# Patient Record
Sex: Female | Born: 1970 | Race: White | Hispanic: No | Marital: Married | Smoking: Never smoker
Health system: Southern US, Community
[De-identification: ages and names within clinical notes are randomized; demographics above are authoritative.]

## PROBLEM LIST (undated history)

## (undated) DIAGNOSIS — F411 Generalized anxiety disorder: Secondary | ICD-10-CM

## (undated) DIAGNOSIS — R7303 Prediabetes: Secondary | ICD-10-CM

## (undated) DIAGNOSIS — T7840XA Allergy, unspecified, initial encounter: Secondary | ICD-10-CM

## (undated) DIAGNOSIS — J4520 Mild intermittent asthma, uncomplicated: Secondary | ICD-10-CM

## (undated) DIAGNOSIS — F32A Depression, unspecified: Secondary | ICD-10-CM

## (undated) DIAGNOSIS — K760 Fatty (change of) liver, not elsewhere classified: Secondary | ICD-10-CM

## (undated) DIAGNOSIS — G43909 Migraine, unspecified, not intractable, without status migrainosus: Secondary | ICD-10-CM

## (undated) DIAGNOSIS — F419 Anxiety disorder, unspecified: Secondary | ICD-10-CM

## (undated) DIAGNOSIS — I10 Essential (primary) hypertension: Secondary | ICD-10-CM

## (undated) DIAGNOSIS — K219 Gastro-esophageal reflux disease without esophagitis: Secondary | ICD-10-CM

## (undated) DIAGNOSIS — R519 Headache, unspecified: Secondary | ICD-10-CM

## (undated) HISTORY — DX: Anxiety disorder, unspecified: F41.9

## (undated) HISTORY — PX: COLONOSCOPY: SHX174

## (undated) HISTORY — DX: Allergy, unspecified, initial encounter: T78.40XA

## (undated) HISTORY — DX: Gastro-esophageal reflux disease without esophagitis: K21.9

---

## 1998-04-04 ENCOUNTER — Other Ambulatory Visit: Admission: RE | Admit: 1998-04-04 | Discharge: 1998-04-04 | Payer: Self-pay | Admitting: Obstetrics and Gynecology

## 1999-04-05 ENCOUNTER — Other Ambulatory Visit: Admission: RE | Admit: 1999-04-05 | Discharge: 1999-04-05 | Payer: Self-pay | Admitting: Obstetrics and Gynecology

## 2000-05-25 ENCOUNTER — Other Ambulatory Visit: Admission: RE | Admit: 2000-05-25 | Discharge: 2000-05-25 | Payer: Self-pay | Admitting: Obstetrics and Gynecology

## 2000-07-14 HISTORY — PX: TONSILLECTOMY: SUR1361

## 2001-06-02 ENCOUNTER — Other Ambulatory Visit: Admission: RE | Admit: 2001-06-02 | Discharge: 2001-06-02 | Payer: Self-pay | Admitting: Obstetrics and Gynecology

## 2002-06-15 ENCOUNTER — Encounter: Payer: Self-pay | Admitting: Internal Medicine

## 2002-07-26 ENCOUNTER — Other Ambulatory Visit: Admission: RE | Admit: 2002-07-26 | Discharge: 2002-07-26 | Payer: Self-pay | Admitting: Obstetrics and Gynecology

## 2003-10-26 ENCOUNTER — Other Ambulatory Visit: Admission: RE | Admit: 2003-10-26 | Discharge: 2003-10-26 | Payer: Self-pay | Admitting: Obstetrics and Gynecology

## 2004-06-13 ENCOUNTER — Ambulatory Visit: Payer: Self-pay | Admitting: Family Medicine

## 2004-10-02 ENCOUNTER — Other Ambulatory Visit: Admission: RE | Admit: 2004-10-02 | Discharge: 2004-10-02 | Payer: Self-pay | Admitting: Obstetrics and Gynecology

## 2006-11-18 ENCOUNTER — Ambulatory Visit: Payer: Self-pay | Admitting: Internal Medicine

## 2006-11-18 DIAGNOSIS — R109 Unspecified abdominal pain: Secondary | ICD-10-CM | POA: Insufficient documentation

## 2006-11-20 LAB — CONVERTED CEMR LAB
ALT: 23 units/L (ref 0–40)
AST: 22 units/L (ref 0–37)
Basophils Absolute: 0 10*3/uL (ref 0.0–0.1)
Basophils Relative: 0.4 % (ref 0.0–1.0)
Bilirubin, Direct: 0.1 mg/dL (ref 0.0–0.3)
CO2: 30 meq/L (ref 19–32)
Creatinine, Ser: 0.6 mg/dL (ref 0.4–1.2)
GFR calc non Af Amer: 120 mL/min
HCT: 36.9 % (ref 36.0–46.0)
MCHC: 34.7 g/dL (ref 30.0–36.0)
Neutrophils Relative %: 57.6 % (ref 43.0–77.0)
Phosphorus: 4.1 mg/dL (ref 2.3–4.6)
RBC: 4.23 M/uL (ref 3.87–5.11)
RDW: 11.9 % (ref 11.5–14.6)
Sodium: 142 meq/L (ref 135–145)
Total Bilirubin: 0.4 mg/dL (ref 0.3–1.2)
WBC: 10.3 10*3/uL (ref 4.5–10.5)

## 2007-05-13 ENCOUNTER — Telehealth (INDEPENDENT_AMBULATORY_CARE_PROVIDER_SITE_OTHER): Payer: Self-pay | Admitting: *Deleted

## 2007-06-16 ENCOUNTER — Ambulatory Visit: Payer: Self-pay | Admitting: Internal Medicine

## 2007-06-30 ENCOUNTER — Encounter: Payer: Self-pay | Admitting: Internal Medicine

## 2007-06-30 ENCOUNTER — Ambulatory Visit: Payer: Self-pay | Admitting: Internal Medicine

## 2007-08-05 DIAGNOSIS — Z8719 Personal history of other diseases of the digestive system: Secondary | ICD-10-CM | POA: Insufficient documentation

## 2007-08-05 DIAGNOSIS — K644 Residual hemorrhoidal skin tags: Secondary | ICD-10-CM | POA: Insufficient documentation

## 2007-08-05 DIAGNOSIS — F411 Generalized anxiety disorder: Secondary | ICD-10-CM | POA: Insufficient documentation

## 2007-08-05 DIAGNOSIS — J3089 Other allergic rhinitis: Secondary | ICD-10-CM | POA: Insufficient documentation

## 2008-07-12 ENCOUNTER — Ambulatory Visit: Payer: Self-pay | Admitting: Family Medicine

## 2010-11-26 NOTE — Assessment & Plan Note (Signed)
[facility]                         GASTROENTEROLOGY OFFICE NOTE   RHEGAN, TRUNNELL                         MRN:          [id number]  DATE:06/16/2007                            DOB:          Oct 04, 1970    CHIEF COMPLAINT:  Burping, gas, bloating, question celiac disease.   HISTORY:  This is a 40 year old white woman whom I have followed for a  family history of colon cancer and polyp.  I sent her a note to come for  a colonoscopy because of the belching and burping problems and bloating  and gaseousness.  She has come for a visit, wondering if she could have  celiac disease.   Review of the notes in the EMR shows that she has been tried being off  of dairy, which she tells me she has tried Lactaid pills which gave her  cramps.  She does not use mints or anything like that.  She does not  really use caffeine.  If she takes Prilosec, she does not have heartburn  or pyrosis or episodes of feeling warm all over at night; however, she  does have this belching quite frequently.  She does not really mind it,  but she wonders about it.  The bloating and abdominal distention bother  her more.  She had some of that when I saw her in 2003, and I did do  duodenal biopsies, when I performed an upper endoscopy.  These were  negative for celiac disease.  She had a mild chronic gastritis.  Her  colonoscopy was unrevealing at that time, i.e. no polyps.   LABORATORY DATA:  In May were normal with a CBC and CMET as well.  TSH  was normal also.   REVIEW OF SYSTEMS:  She has other problems with bleeding gums.  Her  husband complains that she has bad breath.  She has problems with sinus  drainage quite frequently.  The gums bleed.  She is on some type of oral  prescription mouth wash that may help.  They really bleed when she has  brushed them.  Her GI review of systems is otherwise negative, other  than she perhaps had some dark stools.  She complains of some  epigastric  pain moving over towards the right, but it is very vague in character  and not sharp.  She does still have her gallbladder.  See the medical  history format as above.  Otherwise negative.   CURRENT MEDICATIONS:  1. Effexor XR 150 mg daily.  2. TriNessa 28, one tab daily.  3. Prilosec OTC taken daily.  4. Over-the-counter fiber supplements are used sometimes.   ALLERGIES:  No known drug allergies.   PAST MEDICAL/SURGICAL HISTORY:  1. EGD results as above.  2. Gastroesophageal reflux disease.  3. Allergies.  4. Chronic headaches.  5. Sinus drainage.  6. Prior tonsillectomy in 2001.  7. Gingivitis problems.   FAMILY HISTORY:  Heart disease in her mother.  Prostate cancer in her  father.  Father had colon polyps in his 81's.  She had a paternal aunt  with colon cancer in  his 35's.  She has a brother who had colon polyps  in his 47's and he is under surveillance.   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  That is going well.  Some college education.  One son at home.  No  tobacco, alcohol or drugs.   PHYSICAL EXAMINATION:  GENERAL:  A well-developed and well-nourished  female.  VITAL SIGNS:  Height 5 feet 3 inches, weight 128 pounds, blood pressure  96/62, pulse 88.  HEENT:  Eyes anicteric.  ENT:  Mouth, posterior pharynx and gums look  normal to me today.  NECK:  Supple, no thyromegaly.  CHEST:  Clear.  HEART:  S1 and S2.  No murmurs or gallops.  ABDOMEN:  Soft, nontender.  No organomegaly or mass.  EXTREMITIES:  Free of edema.  SKIN:  Warm and dry.  I see no rash on the extensor surfaces or  elsewhere on the upper trunk.  LYMPH:  No neck or supraclavicular nodes.  NEUROLOGIC:  Intact.  She is alert and oriented x3.   ASSESSMENT:  1. Bloating and dyspepsia, etiology not clear:  Celiac disease is      possible, though less likely with the negative biopsies.  2. Halitosis probably from sinus drainage or perhaps the gum problem:       This is almost never from gastrointestinal problem.  3. Belching:  It may be that she has an incompetent lower esophageal      sphincter, which leads to reflux and she cannot control air at this      time.  I think this is possible.   PLAN:  1. Tissue transglutaminase antibody and IgA level to double check for      celiac disease.  2. Schedule a screening colonoscopy, because it has been five years.      This is for a family history of colon cancer and polyps.  3. A trial of Align for bloating and problems that are probably      irritable bowel, but could be occult celiac disease.  4. Continue Prilosec daily.  5. Further plans pending the clinical course and these results.     Iva Boop, MD,FACG  Electronically Signed   CEG/MedQ  DD: 06/16/2007  DT: 06/16/2007  Job #: [id number]   cc:   Karie Schwalbe, MD

## 2012-05-21 ENCOUNTER — Encounter: Payer: Self-pay | Admitting: Internal Medicine

## 2013-03-09 ENCOUNTER — Encounter: Payer: Self-pay | Admitting: Internal Medicine

## 2014-06-28 ENCOUNTER — Other Ambulatory Visit: Payer: Self-pay | Admitting: Obstetrics and Gynecology

## 2014-06-29 LAB — CYTOLOGY - PAP

## 2016-05-31 DIAGNOSIS — Z23 Encounter for immunization: Secondary | ICD-10-CM | POA: Diagnosis not present

## 2016-06-19 ENCOUNTER — Ambulatory Visit (INDEPENDENT_AMBULATORY_CARE_PROVIDER_SITE_OTHER): Payer: BLUE CROSS/BLUE SHIELD | Admitting: Family Medicine

## 2016-06-19 ENCOUNTER — Encounter: Payer: Self-pay | Admitting: Family Medicine

## 2016-06-19 VITALS — BP 118/66 | HR 106 | Temp 98.0°F | Resp 18 | Ht 63.0 in | Wt 148.0 lb

## 2016-06-19 DIAGNOSIS — N3090 Cystitis, unspecified without hematuria: Secondary | ICD-10-CM | POA: Diagnosis not present

## 2016-06-19 DIAGNOSIS — R000 Tachycardia, unspecified: Secondary | ICD-10-CM | POA: Diagnosis not present

## 2016-06-19 DIAGNOSIS — Z114 Encounter for screening for human immunodeficiency virus [HIV]: Secondary | ICD-10-CM

## 2016-06-19 DIAGNOSIS — R142 Eructation: Secondary | ICD-10-CM

## 2016-06-19 DIAGNOSIS — G4709 Other insomnia: Secondary | ICD-10-CM | POA: Diagnosis not present

## 2016-06-19 DIAGNOSIS — E781 Pure hyperglyceridemia: Secondary | ICD-10-CM | POA: Diagnosis not present

## 2016-06-19 DIAGNOSIS — R5383 Other fatigue: Secondary | ICD-10-CM | POA: Diagnosis not present

## 2016-06-19 DIAGNOSIS — K219 Gastro-esophageal reflux disease without esophagitis: Secondary | ICD-10-CM | POA: Diagnosis not present

## 2016-06-19 DIAGNOSIS — Z23 Encounter for immunization: Secondary | ICD-10-CM | POA: Diagnosis not present

## 2016-06-19 DIAGNOSIS — Z131 Encounter for screening for diabetes mellitus: Secondary | ICD-10-CM

## 2016-06-19 DIAGNOSIS — F411 Generalized anxiety disorder: Secondary | ICD-10-CM | POA: Diagnosis not present

## 2016-06-19 MED ORDER — TRAZODONE HCL 50 MG PO TABS: 25.0000 mg | ORAL_TABLET | Freq: Every evening | ORAL | 0 refills | Status: DC | PRN

## 2016-06-19 NOTE — Progress Notes (Signed)
Name: Mikayla Fritz   MRN: [id number]    DOB: May 28, 1971   Date:06/19/2016       Progress Note  Subjective  Chief Complaint  Chief Complaint  Patient presents with  . Establish Care  . Gastroesophageal Reflux    Would like to be tested for lactose intolerance, due to having excessive belching    HPI  GAD: she has been getting Effexor, given by GYN for many years. She states she does not feel depressed, there is a lot of family history of anger and medication keeps her calm.   Insomnia: she has difficulty falling asleep, but able to stay asleep. She states that her husband snores and jerks during the night and frustrates her, she gets angry about it  GERD: doing well at this time, takes Tums prn or Ranitidine. Usually triggered by eating spicy food. Red sauces. Currently controlled with life style modification. She is still drinking tea.   Eructation: she feels like she burps all the time, no abdominal pain, no flatulence. She has been avoiding dairy and is doing better.   Hypertriglyceridemia: found by gyn, but not sure what it was . We will recheck labs   Patient Active Problem List   Diagnosis Date Noted  . GERD without esophagitis 06/19/2016  . Hypertriglyceridemia 06/19/2016  . Recurrent cystitis 06/19/2016  . External hemorrhoids 08/05/2007  . Perennial allergic rhinitis 08/05/2007  . History of gastritis 08/05/2007  . GAD (generalized anxiety disorder) 08/05/2007    Past Surgical History:  Procedure Laterality Date  . TONSILLECTOMY  2002    Family History  Problem Relation Age of Onset  . Heart disease Mother   . Cancer Mother     Kidney  . Heart attack Mother     3  . Arthritis Mother   . Thyroid disease Mother   . Hypertension Father   . Diabetes Father   . Anxiety disorder Brother   . Cancer Maternal Grandmother     Breast    Social History   Social History  . Marital status: Married    Spouse name: N/A  . Number of children: N/A  . Years of  education: N/A   Occupational History  . Not on file.   Social History Main Topics  . Smoking status: Never Smoker  . Smokeless tobacco: Never Used  . Alcohol use No  . Drug use: No  . Sexual activity: Yes    Partners: Male     Comment: Husband had vasectomy   Other Topics Concern  . Not on file   Social History Narrative   Works at [employer], married     Current Outpatient Prescriptions:  .  SULFAMETHOXAZOLE-TRIMETHOPRIM PO, Take 1 tablet by mouth 2 (two) times daily as needed., Disp: , Rfl:  .  venlafaxine XR (EFFEXOR-XR) 150 MG 24 hr capsule, Take 150 mg by mouth daily with breakfast., Disp: , Rfl:   Allergies  Allergen Reactions  . Codeine     REACTION: makes feel loopy     ROS  Constitutional: Negative for fever or weight change.  Respiratory: Negative for cough and shortness of breath.   Cardiovascular: Negative for chest pain or palpitations.  Gastrointestinal: Negative for abdominal pain, no bowel changes.  Musculoskeletal: Negative for gait problem or joint swelling.  Skin: Negative for rash.  Neurological: Negative for dizziness or headache.  No other specific complaints in a complete review of systems (except as listed in HPI above).  Objective  Vitals:   06/19/16  1020  BP: 118/66  Pulse: (!) 127  Resp: 18  Temp: 98 F (36.7 C)  TempSrc: Oral  SpO2: 98%  Weight: 148 lb (67.1 kg)  Height: 5\' 3"  (1.6 m)    Body mass index is 26.22 kg/m.  Physical Exam  Constitutional: Patient appears well-developed and well-nourished. No distress.  HEENT: head atraumatic, normocephalic, pupils equal and reactive to light, neck supple, throat within normal limits Cardiovascular: elevated heart rate, regular rhythm and normal heart sounds.  No murmur heard. No BLE edema. Pulmonary/Chest: Effort normal and breath sounds normal. No respiratory distress. Abdominal: Soft.  There is no tenderness. Psychiatric: Patient has a normal mood and affect. behavior is  normal. Judgment and thought content normal.  PHQ2/9: Depression screen PHQ 2/9 06/19/2016  Decreased Interest 0  Down, Depressed, Hopeless 0  PHQ - 2 Score 0     Fall Risk: Fall Risk  06/19/2016  Falls in the past year? No   GAD 7 : Generalized Anxiety Score 06/19/2016  Nervous, Anxious, on Edge 0  Control/stop worrying 0  Worry too much - different things 0  Trouble relaxing 3  Restless 0  Easily annoyed or irritable 3  Afraid - awful might happen 0  Total GAD 7 Score 6  Anxiety Difficulty Somewhat difficult     Assessment & Plan  1. Hypertriglyceridemia  - Lipid panel  2. GERD without esophagitis  Continue prn medication   3. Encounter for screening for HIV  - HIV antibody  4. Diabetes mellitus screening  -hgbA1C  5. Recurrent cystitis  Continue Septra after intercourse  6. GAD (generalized anxiety disorder)  Continue medication   7. Need for Tdap vaccination  -Tdap  8. Eructation  Discussed gas-X  9. Other fatigue  - COMPLETE METABOLIC PANEL WITH GFR - TSH - VITAMIN D 25 Hydroxy (Vit-D Deficiency, Fractures) - Vitamin B12 - CBC with Differential/Platelet - Hemoglobin A1c  10. Other insomnia  - traZODone (DESYREL) 50 MG tablet; Take 0.5-1 tablets (25-50 mg total) by mouth at bedtime as needed for sleep.  Dispense: 30 tablet; Refill: 0   11. Tachycardia  - EKG 12-Lead - normal

## 2016-06-20 LAB — CBC WITH DIFFERENTIAL/PLATELET
BASOS ABS: 0 {cells}/uL (ref 0–200)
Basophils Relative: 0 %
EOS PCT: 1 %
Eosinophils Absolute: 149 cells/uL (ref 15–500)
HCT: 41 % (ref 35.0–45.0)
Hemoglobin: 13.7 g/dL (ref 11.7–15.5)
Lymphocytes Relative: 28 %
Lymphs Abs: 4172 cells/uL — ABNORMAL HIGH (ref 850–3900)
MCH: 29.4 pg (ref 27.0–33.0)
MCHC: 33.4 g/dL (ref 32.0–36.0)
MCV: 88 fL (ref 80.0–100.0)
MONOS PCT: 4 %
MPV: 8.7 fL (ref 7.5–12.5)
Monocytes Absolute: 596 cells/uL (ref 200–950)
NEUTROS ABS: 9983 {cells}/uL — AB (ref 1500–7800)
NEUTROS PCT: 67 %
PLATELETS: 371 10*3/uL (ref 140–400)
RBC: 4.66 MIL/uL (ref 3.80–5.10)
RDW: 13.6 % (ref 11.0–15.0)
WBC: 14.9 10*3/uL — AB (ref 3.8–10.8)

## 2016-06-20 LAB — COMPLETE METABOLIC PANEL WITH GFR
ALBUMIN: 4.3 g/dL (ref 3.6–5.1)
ALK PHOS: 83 U/L (ref 33–115)
ALT: 20 U/L (ref 6–29)
AST: 18 U/L (ref 10–35)
BILIRUBIN TOTAL: 0.4 mg/dL (ref 0.2–1.2)
BUN: 10 mg/dL (ref 7–25)
CALCIUM: 9 mg/dL (ref 8.6–10.2)
CHLORIDE: 106 mmol/L (ref 98–110)
CO2: 24 mmol/L (ref 20–31)
CREATININE: 0.74 mg/dL (ref 0.50–1.10)
GFR, Est Non African American: >89 mL/min (ref >=60)
Glucose, Bld: 91 mg/dL (ref 65–99)
Potassium: 4.3 mmol/L (ref 3.5–5.3)
Sodium: 140 mmol/L (ref 135–146)
TOTAL PROTEIN: 6.7 g/dL (ref 6.1–8.1)

## 2016-06-20 LAB — LIPID PANEL
CHOLESTEROL: 198 mg/dL (ref <200)
HDL: 36 mg/dL — ABNORMAL LOW (ref >50)
LDL Cholesterol: 91 mg/dL (ref <100)
Total CHOL/HDL Ratio: 5.5 Ratio — ABNORMAL HIGH (ref <5.0)
Triglycerides: 354 mg/dL — ABNORMAL HIGH (ref <150)
VLDL: 71 mg/dL — AB (ref <30)

## 2016-06-20 LAB — VITAMIN B12: VITAMIN B 12: 935 pg/mL (ref 200–1100)

## 2016-06-20 LAB — TSH: TSH: 2.14 mIU/L

## 2016-06-21 LAB — HIV ANTIBODY (ROUTINE TESTING W REFLEX): HIV 1&2 Ab, 4th Generation: NONREACTIVE

## 2016-06-21 LAB — HEMOGLOBIN A1C
HEMOGLOBIN A1C: 5.1 % (ref <5.7)
Mean Plasma Glucose: 100 mg/dL

## 2016-06-21 LAB — VITAMIN D 25 HYDROXY (VIT D DEFICIENCY, FRACTURES): VIT D 25 HYDROXY: 24 ng/mL — AB (ref 30–100)

## 2016-06-22 ENCOUNTER — Other Ambulatory Visit: Payer: Self-pay | Admitting: Family Medicine

## 2016-06-22 DIAGNOSIS — D72829 Elevated white blood cell count, unspecified: Secondary | ICD-10-CM

## 2016-06-22 MED ORDER — OMEGA-3-ACID ETHYL ESTERS 1 G PO CAPS: 1.0000 g | ORAL_CAPSULE | Freq: Two times a day (BID) | ORAL | 2 refills | Status: DC

## 2016-06-23 ENCOUNTER — Telehealth: Payer: Self-pay | Admitting: Family Medicine

## 2016-06-23 NOTE — Telephone Encounter (Signed)
Patient returning your call.

## 2016-06-23 NOTE — Telephone Encounter (Signed)
Mikayla Fritz spoke with patient

## 2016-06-27 ENCOUNTER — Other Ambulatory Visit: Payer: Self-pay

## 2016-06-27 DIAGNOSIS — D72829 Elevated white blood cell count, unspecified: Secondary | ICD-10-CM

## 2016-06-27 LAB — CBC WITH DIFFERENTIAL/PLATELET
BASOS ABS: 0 {cells}/uL (ref 0–200)
Basophils Relative: 0 %
EOS PCT: 2 %
Eosinophils Absolute: 224 cells/uL (ref 15–500)
HCT: 41.2 % (ref 35.0–45.0)
HEMOGLOBIN: 13.8 g/dL (ref 11.7–15.5)
LYMPHS PCT: 41 %
Lymphs Abs: 4592 cells/uL — ABNORMAL HIGH (ref 850–3900)
MCH: 29.2 pg (ref 27.0–33.0)
MCHC: 33.5 g/dL (ref 32.0–36.0)
MCV: 87.3 fL (ref 80.0–100.0)
MONOS PCT: 5 %
MPV: 8.7 fL (ref 7.5–12.5)
Monocytes Absolute: 560 cells/uL (ref 200–950)
NEUTROS PCT: 52 %
Neutro Abs: 5824 cells/uL (ref 1500–7800)
Platelets: 374 10*3/uL (ref 140–400)
RBC: 4.72 MIL/uL (ref 3.80–5.10)
RDW: 13.4 % (ref 11.0–15.0)
WBC: 11.2 10*3/uL — ABNORMAL HIGH (ref 3.8–10.8)

## 2016-09-08 DIAGNOSIS — R319 Hematuria, unspecified: Secondary | ICD-10-CM | POA: Diagnosis not present

## 2016-09-08 DIAGNOSIS — Z01419 Encounter for gynecological examination (general) (routine) without abnormal findings: Secondary | ICD-10-CM | POA: Diagnosis not present

## 2016-09-08 DIAGNOSIS — N390 Urinary tract infection, site not specified: Secondary | ICD-10-CM | POA: Diagnosis not present

## 2016-09-08 DIAGNOSIS — Z1231 Encounter for screening mammogram for malignant neoplasm of breast: Secondary | ICD-10-CM | POA: Diagnosis not present

## 2016-09-08 DIAGNOSIS — Z6826 Body mass index (BMI) 26.0-26.9, adult: Secondary | ICD-10-CM | POA: Diagnosis not present

## 2016-09-08 LAB — HM PAP SMEAR: HM PAP: NEGATIVE

## 2016-09-09 LAB — HM MAMMOGRAPHY: HM MAMMO: NORMAL (ref 0–4)

## 2016-09-18 ENCOUNTER — Ambulatory Visit (INDEPENDENT_AMBULATORY_CARE_PROVIDER_SITE_OTHER): Payer: BLUE CROSS/BLUE SHIELD | Admitting: Family Medicine

## 2016-09-18 ENCOUNTER — Encounter: Payer: Self-pay | Admitting: Family Medicine

## 2016-09-18 VITALS — BP 126/64 | HR 120 | Temp 97.9°F | Resp 18 | Ht 63.0 in | Wt 148.2 lb

## 2016-09-18 DIAGNOSIS — E786 Lipoprotein deficiency: Secondary | ICD-10-CM | POA: Diagnosis not present

## 2016-09-18 DIAGNOSIS — D72829 Elevated white blood cell count, unspecified: Secondary | ICD-10-CM | POA: Diagnosis not present

## 2016-09-18 DIAGNOSIS — R3129 Other microscopic hematuria: Secondary | ICD-10-CM

## 2016-09-18 DIAGNOSIS — F411 Generalized anxiety disorder: Secondary | ICD-10-CM

## 2016-09-18 DIAGNOSIS — K219 Gastro-esophageal reflux disease without esophagitis: Secondary | ICD-10-CM

## 2016-09-18 DIAGNOSIS — E781 Pure hyperglyceridemia: Secondary | ICD-10-CM

## 2016-09-18 LAB — CBC WITH DIFFERENTIAL/PLATELET
BASOS PCT: 0 %
Basophils Absolute: 0 cells/uL (ref 0–200)
Eosinophils Absolute: 103 cells/uL (ref 15–500)
Eosinophils Relative: 1 %
HCT: 41.8 % (ref 35.0–45.0)
HEMOGLOBIN: 13.6 g/dL (ref 11.7–15.5)
LYMPHS ABS: 3502 {cells}/uL (ref 850–3900)
Lymphocytes Relative: 34 %
MCH: 28.6 pg (ref 27.0–33.0)
MCHC: 32.5 g/dL (ref 32.0–36.0)
MCV: 88 fL (ref 80.0–100.0)
MONO ABS: 515 {cells}/uL (ref 200–950)
MPV: 8.9 fL (ref 7.5–12.5)
Monocytes Relative: 5 %
NEUTROS PCT: 60 %
Neutro Abs: 6180 cells/uL (ref 1500–7800)
Platelets: 361 10*3/uL (ref 140–400)
RBC: 4.75 MIL/uL (ref 3.80–5.10)
RDW: 13.3 % (ref 11.0–15.0)
WBC: 10.3 10*3/uL (ref 3.8–10.8)

## 2016-09-18 NOTE — Progress Notes (Signed)
Name: Mikayla Fritz   MRN: [id number]    DOB: 06-15-1971   Date:09/18/2016       Progress Note  Subjective  Chief Complaint  Chief Complaint  Patient presents with  . Medication Refill    3 month F/U  . GAD    Well controlled with medication    HPI  GAD: she has been getting Effexor, given by GYN for many years. She states she does not feel depressed, there is a lot of family history of anger and medication keeps her calm.   Insomnia: she has difficulty falling asleep, but able to stay asleep. She states that her husband snores and jerks during the night and frustrates her, she gets angry about it. We gave her Trazodone but she never started medication, sleeps in her couch at times.   GERD: doing well at this time, takes Tums prn or Ranitidine. Usually triggered by eating spicy food. Red sauces. Currently controlled with life style modification. She is still drinking tea.   Hypertriglyceridemia: found by gyn, triglycerides was 354 here in December and low HDL, we sent rx for Lovaza but she did not fill it, we will try life style modification first. She will start exercising daily - 5 days a week, and eat more fish and try tree nuts daily and more fruit and vegetables   Patient Active Problem List   Diagnosis Date Noted  . Low HDL (under 40) 09/18/2016  . GERD without esophagitis 06/19/2016  . Hypertriglyceridemia 06/19/2016  . Recurrent cystitis 06/19/2016  . External hemorrhoids 08/05/2007  . Perennial allergic rhinitis 08/05/2007  . History of gastritis 08/05/2007  . GAD (generalized anxiety disorder) 08/05/2007    Past Surgical History:  Procedure Laterality Date  . TONSILLECTOMY  2002    Family History  Problem Relation Age of Onset  . Heart disease Mother   . Cancer Mother     Kidney  . Heart attack Mother     3  . Arthritis Mother   . Thyroid disease Mother   . Hypertension Father   . Diabetes Father   . Anxiety disorder Brother   . Cancer Maternal  Grandmother     Breast    Social History   Social History  . Marital status: Married    Spouse name: N/A  . Number of children: N/A  . Years of education: N/A   Occupational History  . Not on file.   Social History Main Topics  . Smoking status: Never Smoker  . Smokeless tobacco: Never Used  . Alcohol use No  . Drug use: No  . Sexual activity: Yes    Partners: Male     Comment: Husband had vasectomy   Other Topics Concern  . Not on file   Social History Narrative   Works at [employer], married     Current Outpatient Prescriptions:  .  doxycycline (ADOXA) 100 MG tablet, Take 100 mg by mouth 2 (two) times daily., Disp: , Rfl:  .  omega-3 acid ethyl esters (LOVAZA) 1 g capsule, Take 1 capsule (1 g total) by mouth 2 (two) times daily., Disp: 60 capsule, Rfl: 2 .  SULFAMETHOXAZOLE-TRIMETHOPRIM PO, Take 1 tablet by mouth 2 (two) times daily as needed., Disp: , Rfl:  .  venlafaxine XR (EFFEXOR-XR) 150 MG 24 hr capsule, Take 150 mg by mouth daily with breakfast., Disp: , Rfl:   Allergies  Allergen Reactions  . Codeine     REACTION: makes feel loopy  ROS  Constitutional: Negative for fever or weight change.  Respiratory: Negative for cough and shortness of breath.   Cardiovascular: Negative for chest pain or palpitations.  Gastrointestinal: Negative for abdominal pain, no bowel changes.  Musculoskeletal: Negative for gait problem or joint swelling.  Skin: Negative for rash.  Neurological: Negative for dizziness or headache.  No other specific complaints in a complete review of systems (except as listed in HPI above).  Objective  Vitals:   09/18/16 0922  BP: 126/64  Pulse: (!) 120  Resp: 18  Temp: 97.9 F (36.6 C)  TempSrc: Oral  SpO2: 97%  Weight: 148 lb 3.2 oz (67.2 kg)  Height: 5' 3" (1.6 m)    Body mass index is 26.25 kg/m.  Physical Exam  Constitutional: Patient appears well-developed and well-nourished. Obese  No distress.  HEENT: head  atraumatic, normocephalic, pupils equal and reactive to light,neck supple, throat within normal limits Cardiovascular: Normal rate, regular rhythm and normal heart sounds.  No murmur heard. No BLE edema. Pulmonary/Chest: Effort normal and breath sounds normal. No respiratory distress. Abdominal: Soft.  There is no tenderness. Psychiatric: Patient has a normal mood and affect. behavior is normal. Judgment and thought content normal.  Recent Results (from the past 2160 hour(s))  CBC with Differential/Platelet     Status: Abnormal   Collection Time: 06/27/16  3:33 PM  Result Value Ref Range   WBC 11.2 (H) 3.8 - 10.8 K/uL   RBC 4.72 3.80 - 5.10 MIL/uL   Hemoglobin 13.8 11.7 - 15.5 g/dL   HCT 41.2 35.0 - 45.0 %   MCV 87.3 80.0 - 100.0 fL   MCH 29.2 27.0 - 33.0 pg   MCHC 33.5 32.0 - 36.0 g/dL   RDW 13.4 11.0 - 15.0 %   Platelets 374 140 - 400 K/uL   MPV 8.7 7.5 - 12.5 fL   Neutro Abs 5,824 1,500 - 7,800 cells/uL   Lymphs Abs 4,592 (H) 850 - 3,900 cells/uL   Monocytes Absolute 560 200 - 950 cells/uL   Eosinophils Absolute 224 15 - 500 cells/uL   Basophils Absolute 0 0 - 200 cells/uL   Neutrophils Relative % 52 %   Lymphocytes Relative 41 %   Monocytes Relative 5 %   Eosinophils Relative 2 %   Basophils Relative 0 %   Smear Review Criteria for review not met      PHQ2/9: Depression screen [facility] 2/9 09/18/2016 06/19/2016  Decreased Interest 0 0  Down, Depressed, Hopeless 0 0  PHQ - 2 Score 0 0     Fall Risk: Fall Risk  09/18/2016 06/19/2016  Falls in the past year? No No    Functional Status Survey: Is the patient deaf or have difficulty hearing?: No Does the patient have difficulty seeing, even when wearing glasses/contacts?: No Does the patient have difficulty concentrating, remembering, or making decisions?: No Does the patient have difficulty walking or climbing stairs?: No Does the patient have difficulty dressing or bathing?: No Does the patient have difficulty doing errands  alone such as visiting a doctor's office or shopping?: No  Assessment & Plan  1. GAD (generalized anxiety disorder)  Stable on medication  2. GERD without esophagitis  stable  3. Hypertriglyceridemia  She never started Lovaza, discussed life style modification, recheck labs in 6 month  4. Low HDL (under 40)  Lipid panel shows low HDL : to improve HDL patient  needs to eat tree nuts ( pecans/pistachios/almonds ) four times weekly, eat fish two times weekly  and exercise  at least 150 minutes per week   5. Hematuria, microscopic   Went to gyn last month, and had blood in urine, urine culture negative, we will send urine to lab, if above 5 we will refer to Urologist, still on antibiotics - Urinalysis, Routine w reflex microscopic  6. Leukocytosis, unspecified type   - CBC with Differential/Platelet  

## 2016-09-19 LAB — URINALYSIS, ROUTINE W REFLEX MICROSCOPIC
BILIRUBIN URINE: NEGATIVE
GLUCOSE, UA: NEGATIVE
HGB URINE DIPSTICK: NEGATIVE
Ketones, ur: NEGATIVE
NITRITE: NEGATIVE
PH: 6 (ref 5.0–8.0)
Protein, ur: NEGATIVE
SPECIFIC GRAVITY, URINE: 1.026 (ref 1.001–1.035)

## 2016-09-19 LAB — URINALYSIS, MICROSCOPIC ONLY: CASTS: NONE SEEN [LPF]

## 2016-12-11 ENCOUNTER — Telehealth: Payer: Self-pay

## 2016-12-11 NOTE — Telephone Encounter (Signed)
LMOM to inform pt of needing an appt

## 2016-12-11 NOTE — Telephone Encounter (Signed)
Patient states the past she is having coughing yellow mucus, congestion, sore throat and is going to the beach and wanted to know could you call her in something. Patients Uncle has had a sinus infection and she she has been around him. But now his symptoms have worsen and it has become bronchitis. Informed patient Dr. Ancil Boozer is out and I would have to ask Raquel Sarna. [phone]

## 2016-12-11 NOTE — Telephone Encounter (Signed)
Could you please see if patient can come in?

## 2016-12-11 NOTE — Telephone Encounter (Signed)
Please let her know that I cannot call in any medications without seeing her first. I apologize for any inconvenience. I would be happy to see her this afternoon if she'd like to come it, or tomorrow morning.  Thank you!

## 2017-03-20 ENCOUNTER — Ambulatory Visit: Payer: BLUE CROSS/BLUE SHIELD | Admitting: Family Medicine

## 2017-10-08 ENCOUNTER — Ambulatory Visit (INDEPENDENT_AMBULATORY_CARE_PROVIDER_SITE_OTHER): Payer: BLUE CROSS/BLUE SHIELD | Admitting: Family Medicine

## 2017-10-08 ENCOUNTER — Encounter: Payer: Self-pay | Admitting: Family Medicine

## 2017-10-08 VITALS — BP 134/78 | HR 77 | Temp 97.9°F | Resp 18 | Ht 63.0 in | Wt 150.0 lb

## 2017-10-08 DIAGNOSIS — F32A Depression, unspecified: Secondary | ICD-10-CM | POA: Insufficient documentation

## 2017-10-08 DIAGNOSIS — F320 Major depressive disorder, single episode, mild: Secondary | ICD-10-CM

## 2017-10-08 DIAGNOSIS — F411 Generalized anxiety disorder: Secondary | ICD-10-CM

## 2017-10-08 DIAGNOSIS — E781 Pure hyperglyceridemia: Secondary | ICD-10-CM

## 2017-10-08 DIAGNOSIS — D72829 Elevated white blood cell count, unspecified: Secondary | ICD-10-CM | POA: Diagnosis not present

## 2017-10-08 DIAGNOSIS — K219 Gastro-esophageal reflux disease without esophagitis: Secondary | ICD-10-CM

## 2017-10-08 DIAGNOSIS — G43019 Migraine without aura, intractable, without status migrainosus: Secondary | ICD-10-CM | POA: Diagnosis not present

## 2017-10-08 DIAGNOSIS — E786 Lipoprotein deficiency: Secondary | ICD-10-CM | POA: Diagnosis not present

## 2017-10-08 DIAGNOSIS — Z131 Encounter for screening for diabetes mellitus: Secondary | ICD-10-CM | POA: Diagnosis not present

## 2017-10-08 MED ORDER — ONDANSETRON HCL 4 MG PO TABS: 4.0000 mg | ORAL_TABLET | Freq: Three times a day (TID) | ORAL | 0 refills | Status: DC | PRN

## 2017-10-08 MED ORDER — SUMATRIPTAN SUCCINATE 100 MG PO TABS: 100.0000 mg | ORAL_TABLET | ORAL | 2 refills | Status: DC | PRN

## 2017-10-08 MED ORDER — VENLAFAXINE HCL ER 75 MG PO CP24: 75.0000 mg | ORAL_CAPSULE | Freq: Every day | ORAL | 2 refills | Status: DC

## 2017-10-08 MED ORDER — TOPIRAMATE 50 MG PO TABS: 25.0000 mg | ORAL_TABLET | Freq: Two times a day (BID) | ORAL | 0 refills | Status: DC

## 2017-10-08 NOTE — Progress Notes (Signed)
Name: Mikayla Fritz   MRN: [id number]    DOB: [birth year]   Date:10/08/2017       Progress Note  Subjective  Chief Complaint  Chief Complaint  Patient presents with  . Migraine    Will usually have the really bad Migraines twice yearly but starting to have them more frequently-her headaches are increasingly in intensity. Happening around a day before her menstrual cycle happens-she will have the light sensivity, nausea and can't move without getting sick.  . Labs Only    Would like to have her labs checked-Father has DM and Mother has Heart Disease and would like to be checked for these conditions.    HPI  Migraine headache: she states long history of migraines, never told me about it, because in the past intensity was 3-4/10 , she able to work and was before her periods. However over the past few months she has noticed that the intensity has increased from 3-4/10 to 8-9/10 , unable to stay at work. She states it can be anywhere in her head, pain is pressure ( feels like her head will explode) and sometimes throbbing, associated with phonophobia, photophobia, nausea and intermittent vomiting. Episodes still once a month, however this past month she had 3 episodes. No recent change in stress lately  Major Depression and GAD: she has been taking Effexor 150 mg for years, given by gyn, however she states she feels numb, sometimes does not have the appropriate emotion -nothing to excite her or make her cry - even bad news. Example: mother called and said she had been diagnosed with cancer and she did not get upset. We will decrease dose and consider changing medication if no improvement  Dyslipidemia: low HDL and high triglycerides, she states she eats when stressed, snacks on junk food. Weight has been stable, but diet is not healthy.   Leukocytosis: in the past but not recently, we will recheck labs  Patient Active Problem List   Diagnosis Date Noted  . Migraine without aura, intractable,  without status migrainosus 10/08/2017  . Mild depression ([place]) 10/08/2017  . Low HDL (under 40) 09/18/2016  . Leukocytosis 09/18/2016  . GERD without esophagitis 06/19/2016  . Hypertriglyceridemia 06/19/2016  . Recurrent cystitis 06/19/2016  . External hemorrhoids 08/05/2007  . Perennial allergic rhinitis 08/05/2007  . History of gastritis 08/05/2007  . GAD (generalized anxiety disorder) 08/05/2007    Past Surgical History:  Procedure Laterality Date  . TONSILLECTOMY  2002    Family History  Problem Relation Age of Onset  . Heart disease Mother   . Cancer Mother        Kidney  . Heart attack Mother        3  . Arthritis Mother   . Thyroid disease Mother   . Hypertension Father   . Diabetes Father   . Anxiety disorder Brother   . Cancer Maternal Grandmother        Breast    Social History   Socioeconomic History  . Marital status: Married    Spouse name: Berneta Sages   . Number of children: 1  . Years of education: Not on file  . Highest education level: Some college, no degree  Occupational History  . Not on file  Social Needs  . Financial resource strain: Not hard at all  . Food insecurity:    Worry: Never true    Inability: Never true  . Transportation needs:    Medical: No    Non-medical: No  Tobacco  Use  . Smoking status: Never Smoker  . Smokeless tobacco: Never Used  Substance and Sexual Activity  . Alcohol use: No  . Drug use: No  . Sexual activity: Yes    Partners: Male    Comment: Husband had vasectomy  Lifestyle  . Physical activity:    Days per week: 0 days    Minutes per session: 0 min  . Stress: Not on file  Relationships  . Social connections:    Talks on phone: More than three times a week    Gets together: More than three times a week    Attends religious service: Never    Active member of club or organization: No    Attends meetings of clubs or organizations: Never    Relationship status: Married  . Intimate partner violence:     Fear of current or ex partner: No    Emotionally abused: No    Physically abused: No    Forced sexual activity: No  Other Topics Concern  . Not on file  Social History Narrative   Works at [employer], married     Current Outpatient Medications:  .  venlafaxine XR (EFFEXOR-XR) 75 MG 24 hr capsule, Take 1 capsule (75 mg total) by mouth daily with breakfast., Disp: 30 capsule, Rfl: 2 .  ondansetron (ZOFRAN) 4 MG tablet, Take 1 tablet (4 mg total) by mouth every 8 (eight) hours as needed for nausea or vomiting., Disp: 20 tablet, Rfl: 0 .  SUMAtriptan (IMITREX) 100 MG tablet, Take 1 tablet (100 mg total) by mouth every 2 (two) hours as needed for migraine. May repeat in 2 hours if headache persists or recurs., Disp: 10 tablet, Rfl: 2 .  topiramate (TOPAMAX) 50 MG tablet, Take 0.5-2 tablets (25-100 mg total) by mouth 2 (two) times daily. 0.5 first 4 days 1 day 5-9 1.5 day 10-14 2 after that, Disp: 60 tablet, Rfl: 0  Allergies  Allergen Reactions  . Codeine     REACTION: makes feel loopy     ROS  Constitutional: Negative for fever or weight change.  Respiratory: Negative for cough and shortness of breath.   Cardiovascular: Negative for chest pain or palpitations.  Gastrointestinal: Negative for abdominal pain, no bowel changes.  Musculoskeletal: Negative for gait problem or joint swelling.  Skin: Negative for rash.  Neurological: Negative for dizziness , positive for intermittent headache.  No other specific complaints in a complete review of systems (except as listed in HPI above).  Objective  Vitals:   10/08/17 0925  BP: 134/78  Pulse: 77  Resp: 18  Temp: 97.9 F (36.6 C)  TempSrc: Oral  SpO2: 96%  Weight: 150 lb (68 kg)  Height: 5\' 3"  (1.6 m)    Body mass index is 26.57 kg/m.  Physical Exam  Constitutional: Patient appears well-developed and well-nourished. No distress.  HEENT: head atraumatic, normocephalic, pupils equal and reactive to light,  neck supple, throat  within normal limits Cardiovascular: Normal rate, regular rhythm and normal heart sounds.  No murmur heard. No BLE edema. Pulmonary/Chest: Effort normal and breath sounds normal. No respiratory distress. Abdominal: Soft.  There is no tenderness. Psychiatric: Patient has a normal mood and affect. behavior is normal. Judgment and thought content normal. Neurological: normal cranial nerves, normal grip, romberg negative  PHQ2/9: Depression screen [facility] 2/9 10/08/2017 09/18/2016 06/19/2016  Decreased Interest 1 0 0  Down, Depressed, Hopeless 1 0 0  PHQ - 2 Score 2 0 0  Altered sleeping 3 - -  Tired, decreased energy 3 - -  Change in appetite 0 - -  Feeling bad or failure about yourself  0 - -  Trouble concentrating 0 - -  Moving slowly or fidgety/restless 0 - -  Suicidal thoughts 0 - -  PHQ-9 Score 8 - -  Difficult doing work/chores Not difficult at all - -   GAD 7 : Generalized Anxiety Score 10/08/2017 06/19/2016  Nervous, Anxious, on Edge 0 0  Control/stop worrying 0 0  Worry too much - different things 0 0  Trouble relaxing 0 3  Restless 0 0  Easily annoyed or irritable 2 3  Afraid - awful might happen 0 0  Total GAD 7 Score 2 6  Anxiety Difficulty - Somewhat difficult     Fall Risk: Fall Risk  10/08/2017 09/18/2016 06/19/2016  Falls in the past year? No No No    Functional Status Survey: Is the patient deaf or have difficulty hearing?: No Does the patient have difficulty seeing, even when wearing glasses/contacts?: No Does the patient have difficulty concentrating, remembering, or making decisions?: No Does the patient have difficulty walking or climbing stairs?: No Does the patient have difficulty dressing or bathing?: No Does the patient have difficulty doing errands alone such as visiting a doctor's office or shopping?: No    Assessment & Plan  1. Migraine without aura, intractable, without status migrainosus  - SUMAtriptan (IMITREX) 100 MG tablet; Take 1 tablet (100 mg  total) by mouth every 2 (two) hours as needed for migraine. May repeat in 2 hours if headache persists or recurs.  Dispense: 10 tablet; Refill: 2 - ondansetron (ZOFRAN) 4 MG tablet; Take 1 tablet (4 mg total) by mouth every 8 (eight) hours as needed for nausea or vomiting.  Dispense: 20 tablet; Refill: 0 - topiramate (TOPAMAX) 50 MG tablet; Take 0.5-2 tablets (25-100 mg total) by mouth 2 (two) times daily. 0.5 first 4 days 1 day 5-9 1.5 day 10-14 2 after that  Dispense: 60 tablet; Refill: 0  2. Hypertriglyceridemia  - COMPLETE METABOLIC PANEL WITH GFR - Lipid panel  3. Leukocytosis, unspecified type  - CBC with Differential/Platelet  4. Low HDL (under 40)   5. GAD (generalized anxiety disorder)  - venlafaxine XR (EFFEXOR-XR) 75 MG 24 hr capsule; Take 1 capsule (75 mg total) by mouth daily with breakfast.  Dispense: 30 capsule; Refill: 2  6. Mild depression (HCC)  - COMPLETE METABOLIC PANEL WITH GFR - venlafaxine XR (EFFEXOR-XR) 75 MG 24 hr capsule; Take 1 capsule (75 mg total) by mouth daily with breakfast.  Dispense: 30 capsule; Refill: 2  7. GERD without esophagitis  Under control at this time  8. Diabetes mellitus screening  - Hemoglobin A1c

## 2017-10-08 NOTE — Patient Instructions (Signed)
Take Topamax at night May take [place] with imitrex and no more than 2 in 24 hours.

## 2017-10-16 ENCOUNTER — Encounter: Payer: Self-pay | Admitting: Family Medicine

## 2017-10-20 ENCOUNTER — Encounter: Payer: Self-pay | Admitting: Family Medicine

## 2017-11-14 ENCOUNTER — Other Ambulatory Visit: Payer: Self-pay | Admitting: Family Medicine

## 2017-11-14 DIAGNOSIS — G43019 Migraine without aura, intractable, without status migrainosus: Secondary | ICD-10-CM

## 2017-11-16 NOTE — Telephone Encounter (Signed)
How is she taking medication?  100 mg twice daily

## 2017-11-17 NOTE — Telephone Encounter (Signed)
Patient has not started the Topamax, has only done the Imitrex when she felt like a migraine was coming on about a month ago. Patient states she only has one once monthly and around her menstrual cycles.

## 2017-12-25 DIAGNOSIS — F320 Major depressive disorder, single episode, mild: Secondary | ICD-10-CM | POA: Diagnosis not present

## 2017-12-25 DIAGNOSIS — Z131 Encounter for screening for diabetes mellitus: Secondary | ICD-10-CM | POA: Diagnosis not present

## 2017-12-25 DIAGNOSIS — E781 Pure hyperglyceridemia: Secondary | ICD-10-CM | POA: Diagnosis not present

## 2017-12-25 DIAGNOSIS — D72829 Elevated white blood cell count, unspecified: Secondary | ICD-10-CM | POA: Diagnosis not present

## 2017-12-26 LAB — COMPLETE METABOLIC PANEL WITH GFR
AG Ratio: 1.9 (calc) (ref 1.0–2.5)
ALBUMIN MSPROF: 4.5 g/dL (ref 3.6–5.1)
ALT: 20 U/L (ref 6–29)
AST: 17 U/L (ref 10–35)
Alkaline phosphatase (APISO): 91 U/L (ref 33–115)
BUN: 12 mg/dL (ref 7–25)
CALCIUM: 9.5 mg/dL (ref 8.6–10.2)
CO2: 27 mmol/L (ref 20–32)
CREATININE: 0.71 mg/dL (ref 0.50–1.10)
Chloride: 104 mmol/L (ref 98–110)
GFR, EST NON AFRICAN AMERICAN: 101 mL/min/{1.73_m2} (ref >=60)
GFR, Est African American: 118 mL/min/{1.73_m2} (ref >=60)
GLOBULIN: 2.4 g/dL (ref 1.9–3.7)
Glucose, Bld: 107 mg/dL — ABNORMAL HIGH (ref 65–99)
Potassium: 4 mmol/L (ref 3.5–5.3)
Sodium: 140 mmol/L (ref 135–146)
TOTAL PROTEIN: 6.9 g/dL (ref 6.1–8.1)
Total Bilirubin: 0.4 mg/dL (ref 0.2–1.2)

## 2017-12-26 LAB — CBC WITH DIFFERENTIAL/PLATELET
BASOS PCT: 0.3 %
Basophils Absolute: 26 cells/uL (ref 0–200)
EOS ABS: 114 {cells}/uL (ref 15–500)
Eosinophils Relative: 1.3 %
HCT: 40.1 % (ref 35.0–45.0)
Hemoglobin: 13.8 g/dL (ref 11.7–15.5)
Lymphs Abs: 3265 cells/uL (ref 850–3900)
MCH: 28.9 pg (ref 27.0–33.0)
MCHC: 34.4 g/dL (ref 32.0–36.0)
MCV: 84.1 fL (ref 80.0–100.0)
MPV: 8.8 fL (ref 7.5–12.5)
Monocytes Relative: 5.3 %
NEUTROS PCT: 56 %
Neutro Abs: 4928 cells/uL (ref 1500–7800)
PLATELETS: 420 10*3/uL — AB (ref 140–400)
RBC: 4.77 10*6/uL (ref 3.80–5.10)
RDW: 12.5 % (ref 11.0–15.0)
TOTAL LYMPHOCYTE: 37.1 %
WBC: 8.8 10*3/uL (ref 3.8–10.8)
WBCMIX: 466 {cells}/uL (ref 200–950)

## 2017-12-26 LAB — LIPID PANEL
Cholesterol: 217 mg/dL — ABNORMAL HIGH (ref <200)
HDL: 34 mg/dL — ABNORMAL LOW (ref >50)
NON-HDL CHOLESTEROL (CALC): 183 mg/dL — AB (ref <130)
Total CHOL/HDL Ratio: 6.4 (calc) — ABNORMAL HIGH (ref <5.0)
Triglycerides: 507 mg/dL — ABNORMAL HIGH (ref <150)

## 2017-12-26 LAB — HEMOGLOBIN A1C
HEMOGLOBIN A1C: 5.3 %{Hb} (ref <5.7)
MEAN PLASMA GLUCOSE: 105 (calc)
eAG (mmol/L): 5.8 (calc)

## 2017-12-28 ENCOUNTER — Other Ambulatory Visit: Payer: Self-pay | Admitting: Family Medicine

## 2017-12-28 DIAGNOSIS — E785 Hyperlipidemia, unspecified: Secondary | ICD-10-CM

## 2017-12-28 MED ORDER — OMEGA-3-ACID ETHYL ESTERS 1 G PO CAPS: 2.0000 g | ORAL_CAPSULE | Freq: Two times a day (BID) | ORAL | 1 refills | Status: DC

## 2017-12-29 ENCOUNTER — Encounter: Payer: Self-pay | Admitting: Family Medicine

## 2017-12-29 DIAGNOSIS — Z01419 Encounter for gynecological examination (general) (routine) without abnormal findings: Secondary | ICD-10-CM | POA: Diagnosis not present

## 2017-12-29 DIAGNOSIS — Z8371 Family history of colonic polyps: Secondary | ICD-10-CM | POA: Diagnosis not present

## 2017-12-29 DIAGNOSIS — Z8042 Family history of malignant neoplasm of prostate: Secondary | ICD-10-CM | POA: Diagnosis not present

## 2017-12-29 DIAGNOSIS — Z8051 Family history of malignant neoplasm of kidney: Secondary | ICD-10-CM | POA: Diagnosis not present

## 2017-12-29 DIAGNOSIS — Z1231 Encounter for screening mammogram for malignant neoplasm of breast: Secondary | ICD-10-CM | POA: Diagnosis not present

## 2017-12-29 DIAGNOSIS — Z6826 Body mass index (BMI) 26.0-26.9, adult: Secondary | ICD-10-CM | POA: Diagnosis not present

## 2017-12-29 DIAGNOSIS — Z808 Family history of malignant neoplasm of other organs or systems: Secondary | ICD-10-CM | POA: Diagnosis not present

## 2017-12-30 LAB — HM PAP SMEAR: HM PAP: NEGATIVE

## 2017-12-31 LAB — HM MAMMOGRAPHY

## 2018-01-06 ENCOUNTER — Encounter: Payer: Self-pay | Admitting: Family Medicine

## 2018-01-06 ENCOUNTER — Ambulatory Visit (INDEPENDENT_AMBULATORY_CARE_PROVIDER_SITE_OTHER): Payer: BLUE CROSS/BLUE SHIELD | Admitting: Family Medicine

## 2018-01-06 VITALS — BP 132/82 | HR 103 | Temp 98.3°F | Resp 18 | Ht 63.0 in | Wt 149.6 lb

## 2018-01-06 DIAGNOSIS — F32A Depression, unspecified: Secondary | ICD-10-CM

## 2018-01-06 DIAGNOSIS — E785 Hyperlipidemia, unspecified: Secondary | ICD-10-CM

## 2018-01-06 DIAGNOSIS — F411 Generalized anxiety disorder: Secondary | ICD-10-CM

## 2018-01-06 DIAGNOSIS — G43019 Migraine without aura, intractable, without status migrainosus: Secondary | ICD-10-CM | POA: Diagnosis not present

## 2018-01-06 DIAGNOSIS — F320 Major depressive disorder, single episode, mild: Secondary | ICD-10-CM

## 2018-01-06 DIAGNOSIS — D473 Essential (hemorrhagic) thrombocythemia: Secondary | ICD-10-CM

## 2018-01-06 DIAGNOSIS — D75839 Thrombocytosis, unspecified: Secondary | ICD-10-CM

## 2018-01-06 MED ORDER — VENLAFAXINE HCL ER 150 MG PO CP24: 150.0000 mg | ORAL_CAPSULE | Freq: Every day | ORAL | 1 refills | Status: DC

## 2018-01-06 MED ORDER — OMEGA-3-ACID ETHYL ESTERS 1 G PO CAPS: 2.0000 g | ORAL_CAPSULE | Freq: Two times a day (BID) | ORAL | 1 refills | Status: DC

## 2018-01-06 NOTE — Progress Notes (Signed)
Name: Mikayla Fritz   MRN: [id number]    DOB: 1971/03/16   Date:01/06/2018       Progress Note  Subjective  Chief Complaint  Chief Complaint  Patient presents with  . Medication Refill    3 month F/U  . Migraine    Nausea but stopped migraine before it has it's full effects by taking Imitrex. Has not started taking daily Topamax  . GAD    States the Effexor 75 mg- still made her angry and started back taking the 150 mg.  . Leukocytosis  . Depression    HPI   Migraine headache: she states long history of migraines, never told me about it, because in the past intensity was 3-4/10 , she able to work and was before her periods. We added Topamax on her last visit, but she never started. It because Imitrex worked well, migraine resolves within 30 minutes of taking medication and only had to take it 3 times in the past 3 months. She denies side effects.   Major Depression and GAD: she tried going down from Effexor 150 to 75 but got irritable, she is back on 150 mg daily and is doing well. Normal phq9 but GAD is 7. She states she has been more stressed at work.   Dyslipidemia: low HDL and high triglycerides, she was eating fast food for breakfast daily, but she is now eating at home before she goes to work. Discussed food options, also importance to get Lovaza filled and take it before next visit   Thrombocytosis: we will recheck next visit.   Patient Active Problem List   Diagnosis Date Noted  . Migraine without aura, intractable, without status migrainosus 10/08/2017  . Mild depression ([place]) 10/08/2017  . Low HDL (under 40) 09/18/2016  . GERD without esophagitis 06/19/2016  . Hypertriglyceridemia 06/19/2016  . Recurrent cystitis 06/19/2016  . External hemorrhoids 08/05/2007  . Perennial allergic rhinitis 08/05/2007  . History of gastritis 08/05/2007  . GAD (generalized anxiety disorder) 08/05/2007    Past Surgical History:  Procedure Laterality Date  . TONSILLECTOMY  2002     Family History  Problem Relation Age of Onset  . Heart disease Mother   . Cancer Mother        Kidney  . Heart attack Mother        3  . Arthritis Mother   . Thyroid disease Mother   . Hypertension Father   . Diabetes Father   . Anxiety disorder Brother   . Cancer Maternal Grandmother        Breast    Social History   Socioeconomic History  . Marital status: Married    Spouse name: Berneta Sages   . Number of children: 1  . Years of education: Not on file  . Highest education level: Some college, no degree  Occupational History  . Not on file  Social Needs  . Financial resource strain: Not hard at all  . Food insecurity:    Worry: Never true    Inability: Never true  . Transportation needs:    Medical: No    Non-medical: No  Tobacco Use  . Smoking status: Never Smoker  . Smokeless tobacco: Never Used  Substance and Sexual Activity  . Alcohol use: No  . Drug use: No  . Sexual activity: Yes    Partners: Male    Comment: Husband had vasectomy  Lifestyle  . Physical activity:    Days per week: 0 days    Minutes  per session: 0 min  . Stress: Not on file  Relationships  . Social connections:    Talks on phone: More than three times a week    Gets together: More than three times a week    Attends religious service: Never    Active member of club or organization: No    Attends meetings of clubs or organizations: Never    Relationship status: Married  . Intimate partner violence:    Fear of current or ex partner: No    Emotionally abused: No    Physically abused: No    Forced sexual activity: No  Other Topics Concern  . Not on file  Social History Narrative   Works at [employer], married     Current Outpatient Medications:  .  ondansetron (ZOFRAN) 4 MG tablet, Take 1 tablet (4 mg total) by mouth every 8 (eight) hours as needed for nausea or vomiting., Disp: 20 tablet, Rfl: 0 .  SUMAtriptan (IMITREX) 100 MG tablet, Take 1 tablet (100 mg total) by mouth every  2 (two) hours as needed for migraine. May repeat in 2 hours if headache persists or recurs., Disp: 10 tablet, Rfl: 2 .  venlafaxine XR (EFFEXOR-XR) 75 MG 24 hr capsule, Take 1 capsule (75 mg total) by mouth daily with breakfast. (Patient taking differently: Take 150 mg by mouth daily with breakfast. ), Disp: 30 capsule, Rfl: 2 .  omega-3 acid ethyl esters (LOVAZA) 1 g capsule, Take 2 capsules (2 g total) by mouth 2 (two) times daily. (Patient not taking: Reported on 01/06/2018), Disp: 360 capsule, Rfl: 1  Allergies  Allergen Reactions  . Codeine     REACTION: makes feel loopy     ROS  Constitutional: Negative for fever or weight change.  Respiratory: Negative for cough and shortness of breath.   Cardiovascular: Negative for chest pain or palpitations.  Gastrointestinal: Negative for abdominal pain, no bowel changes.  Musculoskeletal: Negative for gait problem or joint swelling.  Skin: Negative for rash.  Neurological: Negative for dizziness or headache.  No other specific complaints in a complete review of systems (except as listed in HPI above).  Objective  Vitals:   01/06/18 1049  BP: 132/82  Pulse: (!) 103  Resp: 18  Temp: 98.3 F (36.8 C)  TempSrc: Oral  SpO2: 97%  Weight: 149 lb 9.6 oz (67.9 kg)  Height: 5\' 3"  (1.6 m)    Body mass index is 26.5 kg/m.  Physical Exam  Constitutional: Patient appears well-developed and well-nourished.  No distress.  HEENT: head atraumatic, normocephalic, pupils equal and reactive to light,  neck supple, throat within normal limits Cardiovascular: Normal rate, regular rhythm and normal heart sounds.  No murmur heard. No BLE edema. Pulmonary/Chest: Effort normal and breath sounds normal. No respiratory distress. Abdominal: Soft.  There is no tenderness. Psychiatric: Patient has a normal mood and affect. behavior is normal. Judgment and thought content normal.  Recent Results (from the past 2160 hour(s))  Hemoglobin A1c     Status:  None   Collection Time: 12/25/17  8:42 AM  Result Value Ref Range   Hgb A1c MFr Bld 5.3 <5.7 % of total Hgb    Comment: For the purpose of screening for the presence of diabetes: . <5.7%       Consistent with the absence of diabetes 5.7-6.4%    Consistent with increased risk for diabetes             (prediabetes) > or =6.5%  Consistent  with diabetes . This assay result is consistent with a decreased risk of diabetes. . Currently, no consensus exists regarding use of hemoglobin A1c for diagnosis of diabetes in children. . According to American Diabetes Association (ADA) guidelines, hemoglobin A1c <7.0% represents optimal control in non-pregnant diabetic patients. Different metrics may apply to specific patient populations.  Standards of Medical Care in Diabetes(ADA). .    Mean Plasma Glucose 105 (calc)   eAG (mmol/L) 5.8 (calc)  COMPLETE METABOLIC PANEL WITH GFR     Status: Abnormal   Collection Time: 12/25/17  8:42 AM  Result Value Ref Range   Glucose, Bld 107 (H) 65 - 99 mg/dL    Comment: .            Fasting reference interval . For someone without known diabetes, a glucose value between 100 and 125 mg/dL is consistent with prediabetes and should be confirmed with a follow-up test. .    BUN 12 7 - 25 mg/dL   Creat 0.71 0.50 - 1.10 mg/dL   GFR, Est Non African American 101 > OR = 60 mL/min/1.36m2   GFR, Est African American 118 > OR = 60 mL/min/1.60m2   BUN/Creatinine Ratio NOT APPLICABLE 6 - 22 (calc)   Sodium 140 135 - 146 mmol/L   Potassium 4.0 3.5 - 5.3 mmol/L   Chloride 104 98 - 110 mmol/L   CO2 27 20 - 32 mmol/L   Calcium 9.5 8.6 - 10.2 mg/dL   Total Protein 6.9 6.1 - 8.1 g/dL   Albumin 4.5 3.6 - 5.1 g/dL   Globulin 2.4 1.9 - 3.7 g/dL (calc)   AG Ratio 1.9 1.0 - 2.5 (calc)   Total Bilirubin 0.4 0.2 - 1.2 mg/dL   Alkaline phosphatase (APISO) 91 33 - 115 U/L   AST 17 10 - 35 U/L   ALT 20 6 - 29 U/L  CBC with Differential/Platelet     Status: Abnormal    Collection Time: 12/25/17  8:42 AM  Result Value Ref Range   WBC 8.8 3.8 - 10.8 Thousand/uL   RBC 4.77 3.80 - 5.10 Million/uL   Hemoglobin 13.8 11.7 - 15.5 g/dL   HCT 40.1 35.0 - 45.0 %   MCV 84.1 80.0 - 100.0 fL   MCH 28.9 27.0 - 33.0 pg   MCHC 34.4 32.0 - 36.0 g/dL   RDW 12.5 11.0 - 15.0 %   Platelets 420 (H) 140 - 400 Thousand/uL   MPV 8.8 7.5 - 12.5 fL   Neutro Abs 4,928 1,500 - 7,800 cells/uL   Lymphs Abs 3,265 850 - 3,900 cells/uL   WBC mixed population 466 200 - 950 cells/uL   Eosinophils Absolute 114 15 - 500 cells/uL   Basophils Absolute 26 0 - 200 cells/uL   Neutrophils Relative % 56 %   Total Lymphocyte 37.1 %   Monocytes Relative 5.3 %   Eosinophils Relative 1.3 %   Basophils Relative 0.3 %  Lipid panel     Status: Abnormal   Collection Time: 12/25/17  8:42 AM  Result Value Ref Range   Cholesterol 217 (H) <200 mg/dL   HDL 34 (L) >50 mg/dL   Triglycerides 507 (H) <150 mg/dL   LDL Cholesterol (Calc)  mg/dL (calc)    Comment: . LDL cholesterol not calculated. Triglyceride levels greater than 400 mg/dL invalidate calculated LDL results. . Reference range: <100 . Desirable range <100 mg/dL for primary prevention;   <70 mg/dL for patients with CHD or diabetic patients  with > or =  2 CHD risk factors. Marland Kitchen LDL-C is now calculated using the Martin-Hopkins  calculation, which is a validated novel method providing  better accuracy than the Friedewald equation in the  estimation of LDL-C.  Cresenciano Genre et al. Annamaria Helling. 6045;409(81): 2061-2068  (http://education.[employer].com/faq/FAQ164)    Total CHOL/HDL Ratio 6.4 (H) <5.0 (calc)   Non-HDL Cholesterol (Calc) 183 (H) <130 mg/dL (calc)    Comment: For patients with diabetes plus 1 major ASCVD risk  factor, treating to a non-HDL-C goal of <100 mg/dL  (LDL-C of <70 mg/dL) is considered a therapeutic  option.       PHQ2/9: Depression screen [facility] 2/9 01/06/2018 10/08/2017 09/18/2016 06/19/2016  Decreased Interest 0 1 0 0   Down, Depressed, Hopeless 0 1 0 0  PHQ - 2 Score 0 2 0 0  Altered sleeping 0 3 - -  Tired, decreased energy 2 3 - -  Change in appetite 0 0 - -  Feeling bad or failure about yourself  0 0 - -  Trouble concentrating 0 0 - -  Moving slowly or fidgety/restless 0 0 - -  Suicidal thoughts 0 0 - -  PHQ-9 Score 2 8 - -  Difficult doing work/chores Not difficult at all Not difficult at all - -   GAD 7 : Generalized Anxiety Score 01/06/2018 10/08/2017 06/19/2016  Nervous, Anxious, on Edge 1 0 0  Control/stop worrying 1 0 0  Worry too much - different things 1 0 0  Trouble relaxing 1 0 3  Restless 0 0 0  Easily annoyed or irritable 3 2 3   Afraid - awful might happen 0 0 0  Total GAD 7 Score 7 2 6   Anxiety Difficulty Somewhat difficult - Somewhat difficult     Fall Risk: Fall Risk  01/06/2018 10/08/2017 09/18/2016 06/19/2016  Falls in the past year? No No No No     Functional Status Survey: Is the patient deaf or have difficulty hearing?: No Does the patient have difficulty seeing, even when wearing glasses/contacts?: Yes(glasses) Does the patient have difficulty concentrating, remembering, or making decisions?: No Does the patient have difficulty walking or climbing stairs?: No Does the patient have difficulty dressing or bathing?: No Does the patient have difficulty doing errands alone such as visiting a doctor's office or shopping?: No   Assessment & Plan  1. Mild depression (HCC)  - venlafaxine XR (EFFEXOR-XR) 150 MG 24 hr capsule; Take 1 capsule (150 mg total) by mouth daily with breakfast.  Dispense: 90 capsule; Refill: 1  2. GAD (generalized anxiety disorder)  - venlafaxine XR (EFFEXOR-XR) 150 MG 24 hr capsule; Take 1 capsule (150 mg total) by mouth daily with breakfast.  Dispense: 90 capsule; Refill: 1  3. Dyslipidemia  - omega-3 acid ethyl esters (LOVAZA) 1 g capsule; Take 2 capsules (2 g total) by mouth 2 (two) times daily.  Dispense: 360 capsule; Refill: 1  4.  Thrombocytosis ([place])  Recheck labs next visit   5. Migraine without aura, intractable, without status migrainosus

## 2018-01-19 ENCOUNTER — Encounter: Payer: Self-pay | Admitting: Family Medicine

## 2018-03-03 DIAGNOSIS — Z809 Family history of malignant neoplasm, unspecified: Secondary | ICD-10-CM | POA: Diagnosis not present

## 2018-04-09 ENCOUNTER — Ambulatory Visit: Payer: BLUE CROSS/BLUE SHIELD | Admitting: Family Medicine

## 2018-04-13 ENCOUNTER — Telehealth: Payer: Self-pay | Admitting: Family Medicine

## 2018-04-13 NOTE — Telephone Encounter (Signed)
Do we still have vascepa coupons? Would that be cheaper? I will place a 90 day supply , she will need to take medication for at least one month before we recheck labs.  Ask if she will still use [facility] and if she can stop by to get coupon

## 2018-04-13 NOTE — Telephone Encounter (Signed)
Copied from [place] [id number]. Topic: Inquiry >> Apr 13, 2018 11:58 AM Margot Ables wrote: Reason for CRM: Pt called to RS appt and states that she did not get the prescribed omega-3. She said it was $200+ and cannot afford. She got an OTC fish oil and is asking which dose she should be taking. Pt RS appt to 05/10/18. She also is asking if she should have labs done before the appt. Please call to advise.

## 2018-04-14 MED ORDER — ICOSAPENT ETHYL 1 G PO CAPS: 2.0000 g | ORAL_CAPSULE | Freq: Two times a day (BID) | ORAL | 0 refills | Status: DC

## 2018-04-14 NOTE — Telephone Encounter (Signed)
Please let patient know you have coupon and prescription ready for her

## 2018-04-14 NOTE — Telephone Encounter (Signed)
Patient states she does use [facility] but would like a paper prescription along with the coupon to take it locally for the best price with using [facility].

## 2018-04-14 NOTE — Telephone Encounter (Signed)
Pt informed

## 2018-04-16 ENCOUNTER — Ambulatory Visit: Payer: BLUE CROSS/BLUE SHIELD | Admitting: Family Medicine

## 2018-04-16 ENCOUNTER — Telehealth: Payer: Self-pay | Admitting: Family Medicine

## 2018-04-16 MED ORDER — ICOSAPENT ETHYL 1 G PO CAPS: 2.0000 g | ORAL_CAPSULE | Freq: Two times a day (BID) | ORAL | 0 refills | Status: DC

## 2018-04-16 NOTE — Telephone Encounter (Signed)
Pt said that her husband went to pharm and says that it was still 500.00 and will send her husband to get the voucher on Monday

## 2018-04-16 NOTE — Telephone Encounter (Signed)
Placed call to patient VM to contact her pharmacy for transfer of med to new pharmacy. Any problems please call us back.

## 2018-04-16 NOTE — Telephone Encounter (Signed)
Copied from [place] [id number]. Topic: Quick Communication - See Telephone Encounter >> Apr 16, 2018  9:08 AM Blase Mess A wrote: CRM for notification. See Telephone encounter for: 04/16/18. Patient is calling regarding Icosapent Ethyl (VASCEPA) 1 g CAPS [[id number]]  [facility] [employer]. Patient is requesting the script to be sent [facility] [address] [place] [address] [phone] >> Apr 16, 2018 10:58 AM Marja Kays F wrote: Pt called back stating that [facility] [address] and pt would like [facility] [address] and she will go by and pick it up the medications is vascepa

## 2018-04-16 NOTE — Telephone Encounter (Signed)
Copied from [place] [id number]. Topic: Quick Communication - See Telephone Encounter >> Apr 16, 2018  9:08 AM Blase Mess A wrote: CRM for notification. See Telephone encounter for: 04/16/18. Patient is calling regarding Icosapent Ethyl (VASCEPA) 1 g CAPS [[id number]]  [facility] [place]. Patient is requesting the script to be sent [facility] [place] [address] [phone]

## 2018-04-16 NOTE — Telephone Encounter (Signed)
[facility], please notify patient. Did she get the voucher? I had printed and placed with voucher in our office.

## 2018-05-10 ENCOUNTER — Ambulatory Visit: Payer: BLUE CROSS/BLUE SHIELD | Admitting: Family Medicine

## 2018-07-23 ENCOUNTER — Other Ambulatory Visit: Payer: Self-pay | Admitting: Family Medicine

## 2018-07-23 DIAGNOSIS — F411 Generalized anxiety disorder: Secondary | ICD-10-CM

## 2018-07-23 DIAGNOSIS — F320 Major depressive disorder, single episode, mild: Secondary | ICD-10-CM

## 2018-07-23 DIAGNOSIS — F32A Depression, unspecified: Secondary | ICD-10-CM

## 2018-07-23 NOTE — Telephone Encounter (Signed)
She needs follow up

## 2018-07-23 NOTE — Telephone Encounter (Signed)
Refill request for general medication: Effexor-xr 150 mg  Last office visit: 01/06/2018  Last physical exam: None indicated  Follow-ups on file. None indicated

## 2018-07-26 NOTE — Telephone Encounter (Signed)
LVM for pt to call the office and schedule an appt

## 2018-07-26 NOTE — Telephone Encounter (Signed)
She needs follow up

## 2018-07-27 NOTE — Telephone Encounter (Signed)
Pt called and made medication refill appointment.  Pt wants to know if she can go ahead and get refill

## 2018-07-30 ENCOUNTER — Encounter: Payer: Self-pay | Admitting: Family Medicine

## 2018-07-30 ENCOUNTER — Ambulatory Visit (INDEPENDENT_AMBULATORY_CARE_PROVIDER_SITE_OTHER): Payer: BLUE CROSS/BLUE SHIELD | Admitting: Family Medicine

## 2018-07-30 VITALS — BP 134/72 | HR 103 | Temp 97.6°F | Resp 16 | Ht 63.0 in | Wt 151.3 lb

## 2018-07-30 DIAGNOSIS — Z79899 Other long term (current) drug therapy: Secondary | ICD-10-CM

## 2018-07-30 DIAGNOSIS — E781 Pure hyperglyceridemia: Secondary | ICD-10-CM

## 2018-07-30 DIAGNOSIS — Z23 Encounter for immunization: Secondary | ICD-10-CM

## 2018-07-30 DIAGNOSIS — D473 Essential (hemorrhagic) thrombocythemia: Secondary | ICD-10-CM

## 2018-07-30 DIAGNOSIS — F320 Major depressive disorder, single episode, mild: Secondary | ICD-10-CM

## 2018-07-30 DIAGNOSIS — F411 Generalized anxiety disorder: Secondary | ICD-10-CM | POA: Diagnosis not present

## 2018-07-30 DIAGNOSIS — G43019 Migraine without aura, intractable, without status migrainosus: Secondary | ICD-10-CM

## 2018-07-30 DIAGNOSIS — E785 Hyperlipidemia, unspecified: Secondary | ICD-10-CM

## 2018-07-30 DIAGNOSIS — F32A Depression, unspecified: Secondary | ICD-10-CM

## 2018-07-30 DIAGNOSIS — D75839 Thrombocytosis, unspecified: Secondary | ICD-10-CM | POA: Insufficient documentation

## 2018-07-30 MED ORDER — VENLAFAXINE HCL ER 150 MG PO CP24: 150.0000 mg | ORAL_CAPSULE | Freq: Every day | ORAL | 1 refills | Status: DC

## 2018-07-30 MED ORDER — OMEGA-3-ACID ETHYL ESTERS 1 G PO CAPS: 1.0000 g | ORAL_CAPSULE | Freq: Two times a day (BID) | ORAL | 3 refills | Status: DC

## 2018-07-30 MED ORDER — ICOSAPENT ETHYL 1 G PO CAPS: 1.0000 g | ORAL_CAPSULE | Freq: Two times a day (BID) | ORAL | 1 refills | Status: DC

## 2018-07-30 NOTE — Progress Notes (Signed)
Name: Mikayla Fritz   MRN: [id number]    DOB: 1971-02-26   Date:07/30/2018       Progress Note  Subjective  Chief Complaint  Chief Complaint  Patient presents with  . Medication Refill  . Dyslipidemia  . Migraine  . Depression  . Thromboycytosis    HPI  Migraine headache: she states long history of migraines,  because in the past intensity was 3-4/10 , she able to work, usually prior to her cycles. She states only gets to be severe a couple times a year and she take imitrex and zofran when that happens. Pain is described as intense, all over her head and associated with nausea and vomiting. She has associated phonophobia and photophobia. She states Imitrex helps with symptoms if she takes it right away.   Major Depression and GAD: she tried going down from Effexor 150 to 75 but got irritable, she is doing well on current regiment. She denies side effects of medication. She feels like she is remission.   Dyslipidemia: low HDL and high triglycerides, she eating a lot of fast food, we gave her a rx for Vascepa on her last visit but she did not have a voucher and did not pick it up. She is trying to avoid fried food. She also likes carbs, but is trying to improve diet. We will recheck labs and try re-sending VAscepa   Thrombocytosis: we will recheck with next labs in 6 weeks   Patient Active Problem List   Diagnosis Date Noted  . Thrombocytosis ([place]) 07/30/2018  . Migraine without aura, intractable, without status migrainosus 10/08/2017  . Mild depression ([place]) 10/08/2017  . Low HDL (under 40) 09/18/2016  . GERD without esophagitis 06/19/2016  . Hypertriglyceridemia 06/19/2016  . Recurrent cystitis 06/19/2016  . External hemorrhoids 08/05/2007  . Perennial allergic rhinitis 08/05/2007  . History of gastritis 08/05/2007  . GAD (generalized anxiety disorder) 08/05/2007    Past Surgical History:  Procedure Laterality Date  . TONSILLECTOMY  2002    Family History  Problem  Relation Age of Onset  . Heart disease Mother   . Cancer Mother        Kidney  . Heart attack Mother        3  . Arthritis Mother   . Thyroid disease Mother   . Hypertension Father   . Diabetes Father   . Anxiety disorder Brother   . Cancer Maternal Grandmother        Breast    Social History   Socioeconomic History  . Marital status: Married    Spouse name: Berneta Sages   . Number of children: 1  . Years of education: Not on file  . Highest education level: Some college, no degree  Occupational History  . Not on file  Social Needs  . Financial resource strain: Not hard at all  . Food insecurity:    Worry: Never true    Inability: Never true  . Transportation needs:    Medical: No    Non-medical: No  Tobacco Use  . Smoking status: Never Smoker  . Smokeless tobacco: Never Used  Substance and Sexual Activity  . Alcohol use: No  . Drug use: No  . Sexual activity: Yes    Partners: Male    Comment: Husband had vasectomy  Lifestyle  . Physical activity:    Days per week: 0 days    Minutes per session: 0 min  . Stress: Not at all  Relationships  . Social connections:  Talks on phone: More than three times a week    Gets together: More than three times a week    Attends religious service: Never    Active member of club or organization: No    Attends meetings of clubs or organizations: Never    Relationship status: Married  . Intimate partner violence:    Fear of current or ex partner: No    Emotionally abused: No    Physically abused: No    Forced sexual activity: No  Other Topics Concern  . Not on file  Social History Narrative   Works at [employer], married     Current Outpatient Medications:  .  ondansetron (ZOFRAN) 4 MG tablet, Take 1 tablet (4 mg total) by mouth every 8 (eight) hours as needed for nausea or vomiting., Disp: 20 tablet, Rfl: 0 .  SUMAtriptan (IMITREX) 100 MG tablet, Take 1 tablet (100 mg total) by mouth every 2 (two) hours as needed for  migraine. May repeat in 2 hours if headache persists or recurs., Disp: 10 tablet, Rfl: 2 .  venlafaxine XR (EFFEXOR-XR) 150 MG 24 hr capsule, Take 1 capsule (150 mg total) by mouth daily with breakfast., Disp: 90 capsule, Rfl: 1 .  Icosapent Ethyl (VASCEPA) 1 g CAPS, Take 1 capsule (1 g total) by mouth 2 (two) times daily., Disp: 360 capsule, Rfl: 1  Allergies  Allergen Reactions  . Codeine     REACTION: makes feel loopy    I personally reviewed active problem list, medication list, allergies, family history, social history with the patient/caregiver today.   ROS  Constitutional: Negative for fever or weight change.  Respiratory: Negative for cough and shortness of breath.   Cardiovascular: Negative for chest pain or palpitations.  Gastrointestinal: Negative for abdominal pain, no bowel changes.  Musculoskeletal: Negative for gait problem or joint swelling.  Skin: Negative for rash.  Neurological: Negative for dizziness , positive for occasional  headache.  No other specific complaints in a complete review of systems (except as listed in HPI above).   Objective  Vitals:   07/30/18 1521  BP: 134/72  Pulse: (!) 103  Resp: 16  Temp: 97.6 F (36.4 C)  TempSrc: Oral  SpO2: 97%  Weight: 151 lb 4.8 oz (68.6 kg)  Height: 5\' 3"  (1.6 m)    Body mass index is 26.8 kg/m.  Physical Exam  Constitutional: Patient appears well-developed and well-nourished. No distress.  HEENT: head atraumatic, normocephalic, pupils equal and reactive to light,  neck supple, throat within normal limits Cardiovascular: Normal rate, regular rhythm and normal heart sounds.  No murmur heard. No BLE edema. Pulmonary/Chest: Effort normal and breath sounds normal. No respiratory distress. Abdominal: Soft.  There is no tenderness. Psychiatric: Patient has a normal mood and affect. behavior is normal. Judgment and thought content normal.  PHQ2/9: Depression screen [facility] 2/9 07/30/2018 01/06/2018 10/08/2017  09/18/2016 06/19/2016  Decreased Interest 0 0 1 0 0  Down, Depressed, Hopeless 0 0 1 0 0  PHQ - 2 Score 0 0 2 0 0  Altered sleeping 2 0 3 - -  Tired, decreased energy 2 2 3  - -  Change in appetite 0 0 0 - -  Feeling bad or failure about yourself  0 0 0 - -  Trouble concentrating 0 0 0 - -  Moving slowly or fidgety/restless 0 0 0 - -  Suicidal thoughts 0 0 0 - -  PHQ-9 Score 4 2 8  - -  Difficult doing work/chores  Not difficult at all Not difficult at all Not difficult at all - -     Fall Risk: Fall Risk  07/30/2018 01/06/2018 10/08/2017 09/18/2016 06/19/2016  Falls in the past year? 0 No No No No     Functional Status Survey: Is the patient deaf or have difficulty hearing?: No Does the patient have difficulty seeing, even when wearing glasses/contacts?: Yes Does the patient have difficulty concentrating, remembering, or making decisions?: No Does the patient have difficulty walking or climbing stairs?: No Does the patient have difficulty dressing or bathing?: No Does the patient have difficulty doing errands alone such as visiting a doctor's office or shopping?: No    Assessment & Plan  1. Mild depression (HCC)  - venlafaxine XR (EFFEXOR-XR) 150 MG 24 hr capsule; Take 1 capsule (150 mg total) by mouth daily with breakfast.  Dispense: 90 capsule; Refill: 1 - COMPLETE METABOLIC PANEL WITH GFR  2. Thrombocytosis (HCC)  - CBC with Differential/Platelet  3. GAD (generalized anxiety disorder)  - venlafaxine XR (EFFEXOR-XR) 150 MG 24 hr capsule; Take 1 capsule (150 mg total) by mouth daily with breakfast.  Dispense: 90 capsule; Refill: 1  4. Mild depression (HCC)  - venlafaxine XR (EFFEXOR-XR) 150 MG 24 hr capsule; Take 1 capsule (150 mg total) by mouth daily with breakfast.  Dispense: 90 capsule; Refill: 1 - COMPLETE METABOLIC PANEL WITH GFR  5. Hypertriglyceridemia  - COMPLETE METABOLIC PANEL WITH GFR - Lipid panel - Changed to Lovaza 1 g BID # 60 and 3 refills, since VAscepa  too expensive even with voucher  6. Migraine without aura, intractable, without status migrainosus  Doing well on prn medicaton   7. Dyslipidemia  - Lipid panel  8. Long-term use of high-risk medication  - COMPLETE METABOLIC PANEL WITH GFR   9. Needs flu shot  - Flu Vaccine QUAD 6+ mos PF IM (Fluarix Quad PF)

## 2018-08-11 ENCOUNTER — Ambulatory Visit: Payer: BLUE CROSS/BLUE SHIELD

## 2018-11-18 ENCOUNTER — Other Ambulatory Visit: Payer: Self-pay | Admitting: Family Medicine

## 2019-02-02 ENCOUNTER — Ambulatory Visit: Payer: BLUE CROSS/BLUE SHIELD | Admitting: Family Medicine

## 2019-02-04 ENCOUNTER — Other Ambulatory Visit: Payer: Self-pay | Admitting: Family Medicine

## 2019-02-04 DIAGNOSIS — F320 Major depressive disorder, single episode, mild: Secondary | ICD-10-CM

## 2019-02-04 DIAGNOSIS — F411 Generalized anxiety disorder: Secondary | ICD-10-CM

## 2019-02-04 DIAGNOSIS — F32A Depression, unspecified: Secondary | ICD-10-CM

## 2019-02-08 ENCOUNTER — Other Ambulatory Visit: Payer: Self-pay | Admitting: Family Medicine

## 2019-02-08 DIAGNOSIS — F32A Depression, unspecified: Secondary | ICD-10-CM

## 2019-02-08 DIAGNOSIS — F320 Major depressive disorder, single episode, mild: Secondary | ICD-10-CM

## 2019-02-08 DIAGNOSIS — F411 Generalized anxiety disorder: Secondary | ICD-10-CM

## 2019-02-10 ENCOUNTER — Other Ambulatory Visit: Payer: Self-pay | Admitting: Family Medicine

## 2019-02-10 DIAGNOSIS — F320 Major depressive disorder, single episode, mild: Secondary | ICD-10-CM

## 2019-02-10 DIAGNOSIS — F32A Depression, unspecified: Secondary | ICD-10-CM

## 2019-02-10 DIAGNOSIS — F411 Generalized anxiety disorder: Secondary | ICD-10-CM

## 2019-02-10 NOTE — Telephone Encounter (Signed)
appt scheduled in august

## 2019-02-21 DIAGNOSIS — E785 Hyperlipidemia, unspecified: Secondary | ICD-10-CM | POA: Diagnosis not present

## 2019-02-21 DIAGNOSIS — E781 Pure hyperglyceridemia: Secondary | ICD-10-CM | POA: Diagnosis not present

## 2019-02-21 DIAGNOSIS — Z79899 Other long term (current) drug therapy: Secondary | ICD-10-CM | POA: Diagnosis not present

## 2019-02-21 DIAGNOSIS — D473 Essential (hemorrhagic) thrombocythemia: Secondary | ICD-10-CM | POA: Diagnosis not present

## 2019-02-21 LAB — LIPID PANEL
Cholesterol: 236 mg/dL — ABNORMAL HIGH (ref <200)
HDL: 42 mg/dL — ABNORMAL LOW (ref >=50)
LDL Cholesterol (Calc): 156 mg/dL (calc) — ABNORMAL HIGH
Non-HDL Cholesterol (Calc): 194 mg/dL (calc) — ABNORMAL HIGH (ref <130)
Total CHOL/HDL Ratio: 5.6 (calc) — ABNORMAL HIGH (ref <5.0)
Triglycerides: 222 mg/dL — ABNORMAL HIGH (ref <150)

## 2019-02-21 LAB — COMPLETE METABOLIC PANEL WITH GFR
AG Ratio: 1.8 (calc) (ref 1.0–2.5)
ALT: 25 U/L (ref 6–29)
AST: 22 U/L (ref 10–35)
Albumin: 4.6 g/dL (ref 3.6–5.1)
Alkaline phosphatase (APISO): 69 U/L (ref 31–125)
BUN: 16 mg/dL (ref 7–25)
CO2: 26 mmol/L (ref 20–32)
Calcium: 9.5 mg/dL (ref 8.6–10.2)
Chloride: 104 mmol/L (ref 98–110)
Creat: 0.83 mg/dL (ref 0.50–1.10)
GFR, Est African American: 97 mL/min/{1.73_m2} (ref >=60)
GFR, Est Non African American: 83 mL/min/{1.73_m2} (ref >=60)
Globulin: 2.5 g/dL (calc) (ref 1.9–3.7)
Glucose, Bld: 103 mg/dL — ABNORMAL HIGH (ref 65–99)
Potassium: 4.2 mmol/L (ref 3.5–5.3)
Sodium: 139 mmol/L (ref 135–146)
Total Bilirubin: 0.5 mg/dL (ref 0.2–1.2)
Total Protein: 7.1 g/dL (ref 6.1–8.1)

## 2019-02-21 LAB — CBC WITH DIFFERENTIAL/PLATELET
Absolute Monocytes: 495 cells/uL (ref 200–950)
Basophils Absolute: 59 cells/uL (ref 0–200)
Basophils Relative: 0.6 %
Eosinophils Absolute: 99 cells/uL (ref 15–500)
Eosinophils Relative: 1 %
HCT: 41.1 % (ref 35.0–45.0)
Hemoglobin: 13.8 g/dL (ref 11.7–15.5)
Lymphs Abs: 3425 cells/uL (ref 850–3900)
MCH: 29.7 pg (ref 27.0–33.0)
MCHC: 33.6 g/dL (ref 32.0–36.0)
MCV: 88.6 fL (ref 80.0–100.0)
MPV: 9.1 fL (ref 7.5–12.5)
Monocytes Relative: 5 %
Neutro Abs: 5821 cells/uL (ref 1500–7800)
Neutrophils Relative %: 58.8 %
Platelets: 380 10*3/uL (ref 140–400)
RBC: 4.64 10*6/uL (ref 3.80–5.10)
RDW: 12.8 % (ref 11.0–15.0)
Total Lymphocyte: 34.6 %
WBC: 9.9 10*3/uL (ref 3.8–10.8)

## 2019-02-28 ENCOUNTER — Other Ambulatory Visit: Payer: Self-pay

## 2019-02-28 ENCOUNTER — Ambulatory Visit (INDEPENDENT_AMBULATORY_CARE_PROVIDER_SITE_OTHER): Payer: BC Managed Care – PPO | Admitting: Family Medicine

## 2019-02-28 ENCOUNTER — Encounter: Payer: Self-pay | Admitting: Family Medicine

## 2019-02-28 VITALS — BP 146/90 | HR 92 | Temp 97.4°F | Resp 16 | Ht 63.0 in | Wt 152.5 lb

## 2019-02-28 DIAGNOSIS — G43019 Migraine without aura, intractable, without status migrainosus: Secondary | ICD-10-CM

## 2019-02-28 DIAGNOSIS — F411 Generalized anxiety disorder: Secondary | ICD-10-CM

## 2019-02-28 DIAGNOSIS — E786 Lipoprotein deficiency: Secondary | ICD-10-CM

## 2019-02-28 DIAGNOSIS — R030 Elevated blood-pressure reading, without diagnosis of hypertension: Secondary | ICD-10-CM

## 2019-02-28 DIAGNOSIS — F320 Major depressive disorder, single episode, mild: Secondary | ICD-10-CM

## 2019-02-28 DIAGNOSIS — Z808 Family history of malignant neoplasm of other organs or systems: Secondary | ICD-10-CM

## 2019-02-28 DIAGNOSIS — E781 Pure hyperglyceridemia: Secondary | ICD-10-CM

## 2019-02-28 DIAGNOSIS — E785 Hyperlipidemia, unspecified: Secondary | ICD-10-CM

## 2019-02-28 DIAGNOSIS — F32A Depression, unspecified: Secondary | ICD-10-CM

## 2019-02-28 MED ORDER — OMEGA-3-ACID ETHYL ESTERS 1 G PO CAPS: 1.0000 | ORAL_CAPSULE | Freq: Two times a day (BID) | ORAL | 1 refills | Status: DC

## 2019-02-28 MED ORDER — VENLAFAXINE HCL ER 150 MG PO CP24: 150.0000 mg | ORAL_CAPSULE | Freq: Every day | ORAL | 1 refills | Status: DC

## 2019-02-28 MED ORDER — ATENOLOL 25 MG PO TABS: 25.0000 mg | ORAL_TABLET | Freq: Every day | ORAL | 1 refills | Status: DC

## 2019-02-28 NOTE — Progress Notes (Signed)
Name: Mikayla Fritz   MRN: [id number]    DOB: Jul 19, 1970   Date:02/28/2019       Progress Note  Subjective  Chief Complaint  Chief Complaint  Patient presents with  . Medication Refill  . GAD  . Depression  . Dyslipidemia  . Migraine    Started this morning-has them around her cycle.    HPI  Migraine headache: she states long history of migraines,  because in the past intensity was 3-4/10 , she able to work, usually prior to her cycles, today woke up with an episode and took Excedrin Migraine it usually makes bp spike, but pain is down to 2/10 . Occasionally associated with nausea and very seldom has vomiting . Pain currently is on nuchal area, sometimes throbbing other times is aching. She has associated phonophobia and photophobia. She states Imitrex helps with symptoms if she takes it right away when she can take it right away otherwise take Excedrin migraine   Major Depression and GAD:she tried going down from Effexor 150 to 75 but got irritable, she is doing well on current regiment. She denies side effects of medication. She feels like she is remission. She only has difficulty falling asleep, she states that very sensitive to caffeine intake and sugar   Dyslipidemia: low HDL and high triglycerides, reviewed labs , she has been cooking more avoiding fast food , and lipid panel improved since last visit, however LDL still elevated, but low ASCVD, discussed starting walking more. She is on Lovaza    The 10-year ASCVD risk score Mikey Bussing DC Jr., et al., 2013) is: 2.5%   Values used to calculate the score:     Age: 48 years     Sex: Female     Is Non-Hispanic African American: No     Diabetic: No     Tobacco smoker: No     Systolic Blood Pressure: 938 mmHg     Is BP treated: No     HDL Cholesterol: 42 mg/dL     Total Cholesterol: 236 mg/dL  Thrombocytosis: last level normalized  Prediabetes: based on increase in abdominal girth, elevated bp today, also high triglycerides and low  HDL , discussed life style modification   Skin lesion: she has noticed a bump on right upper back, seems to itch, no redness or bleeding, concerned because mother has a history of melanoma   Patient Active Problem List   Diagnosis Date Noted  . Thrombocytosis ([place]) 07/30/2018  . Migraine without aura, intractable, without status migrainosus 10/08/2017  . Mild depression ([place]) 10/08/2017  . Low HDL (under 40) 09/18/2016  . GERD without esophagitis 06/19/2016  . Hypertriglyceridemia 06/19/2016  . Recurrent cystitis 06/19/2016  . External hemorrhoids 08/05/2007  . Perennial allergic rhinitis 08/05/2007  . History of gastritis 08/05/2007  . GAD (generalized anxiety disorder) 08/05/2007    Past Surgical History:  Procedure Laterality Date  . TONSILLECTOMY  2002    Family History  Problem Relation Age of Onset  . Heart disease Mother   . Cancer Mother        Kidney  . Heart attack Mother        3  . Arthritis Mother   . Thyroid disease Mother   . Hypertension Father   . Diabetes Father   . Anxiety disorder Brother   . Cancer Maternal Grandmother        Breast    Social History   Socioeconomic History  . Marital status: Married    Spouse  name: Berneta Sages   . Number of children: 1  . Years of education: Not on file  . Highest education level: Some college, no degree  Occupational History  . Not on file  Social Needs  . Financial resource strain: Not hard at all  . Food insecurity    Worry: Never true    Inability: Never true  . Transportation needs    Medical: No    Non-medical: No  Tobacco Use  . Smoking status: Never Smoker  . Smokeless tobacco: Never Used  Substance and Sexual Activity  . Alcohol use: No  . Drug use: No  . Sexual activity: Yes    Partners: Male    Comment: Husband had vasectomy  Lifestyle  . Physical activity    Days per week: 0 days    Minutes per session: 0 min  . Stress: Not at all  Relationships  . Social connections    Talks on  phone: More than three times a week    Gets together: More than three times a week    Attends religious service: Never    Active member of club or organization: No    Attends meetings of clubs or organizations: Never    Relationship status: Married  . Intimate partner violence    Fear of current or ex partner: No    Emotionally abused: No    Physically abused: No    Forced sexual activity: No  Other Topics Concern  . Not on file  Social History Narrative   Works at [employer], married     Current Outpatient Medications:  .  omega-3 acid ethyl esters (LOVAZA) 1 g capsule, Take 1 capsule (1 g total) by mouth 2 (two) times daily., Disp: 360 capsule, Rfl: 1 .  ondansetron (ZOFRAN) 4 MG tablet, Take 1 tablet (4 mg total) by mouth every 8 (eight) hours as needed for nausea or vomiting., Disp: 20 tablet, Rfl: 0 .  SUMAtriptan (IMITREX) 100 MG tablet, Take 1 tablet (100 mg total) by mouth every 2 (two) hours as needed for migraine. May repeat in 2 hours if headache persists or recurs., Disp: 10 tablet, Rfl: 2 .  venlafaxine XR (EFFEXOR-XR) 150 MG 24 hr capsule, Take 1 capsule (150 mg total) by mouth daily with breakfast., Disp: 90 capsule, Rfl: 1 .  atenolol (TENORMIN) 25 MG tablet, Take 1 tablet (25 mg total) by mouth daily., Disp: 90 tablet, Rfl: 1  Allergies  Allergen Reactions  . Codeine     REACTION: makes feel loopy    I personally reviewed active problem list, medication list, allergies, family history, social history with the patient/caregiver today.   ROS  Constitutional: Negative for fever or weight change.  Respiratory: Negative for cough and shortness of breath.   Cardiovascular: Negative for chest pain or palpitations.  Gastrointestinal: Negative for abdominal pain, no bowel changes.  Musculoskeletal: Negative for gait problem or joint swelling.  Skin: Negative for rash.  Neurological: Negative for dizziness or headache.  No other specific complaints in a complete  review of systems (except as listed in HPI above).  Objective  Vitals:   02/28/19 1025 02/28/19 1100  BP: (!) 154/96 (!) 146/90  Pulse: (!) 101 92  Resp: 16   Temp: (!) 97.4 F (36.3 C)   TempSrc: Temporal   SpO2: 99%   Weight: 152 lb 8 oz (69.2 kg)   Height: 5\' 3"  (1.6 m)     Body mass index is 27.01 kg/m.  Physical Exam  Constitutional: Patient appears well-developed and well-nourished. Overweight.  No distress.  HEENT: head atraumatic, normocephalic, pupils equal and reactive to light,  neck supple, throat within normal limits Cardiovascular: Normal rate, regular rhythm and normal heart sounds.  No murmur heard. No BLE edema. Pulmonary/Chest: Effort normal and breath sounds normal. No respiratory distress. Abdominal: Soft.  There is no tenderness. Skin: skin tag, explained burning and itching unlikely to be related to the lesion that is bothering her advised follow up with dermatologist secondary to family history of skin cancer Psychiatric: Patient has a normal mood and affect. behavior is normal. Judgment and thought content normal.  Recent Results (from the past 2160 hour(s))  CBC with Differential/Platelet     Status: None   Collection Time: 02/21/19  8:09 AM  Result Value Ref Range   WBC 9.9 3.8 - 10.8 Thousand/uL   RBC 4.64 3.80 - 5.10 Million/uL   Hemoglobin 13.8 11.7 - 15.5 g/dL   HCT 41.1 35.0 - 45.0 %   MCV 88.6 80.0 - 100.0 fL   MCH 29.7 27.0 - 33.0 pg   MCHC 33.6 32.0 - 36.0 g/dL   RDW 12.8 11.0 - 15.0 %   Platelets 380 140 - 400 Thousand/uL   MPV 9.1 7.5 - 12.5 fL   Neutro Abs 5,821 1,500 - 7,800 cells/uL   Lymphs Abs 3,425 850 - 3,900 cells/uL   Absolute Monocytes 495 200 - 950 cells/uL   Eosinophils Absolute 99 15 - 500 cells/uL   Basophils Absolute 59 0 - 200 cells/uL   Neutrophils Relative % 58.8 %   Total Lymphocyte 34.6 %   Monocytes Relative 5.0 %   Eosinophils Relative 1.0 %   Basophils Relative 0.6 %  COMPLETE METABOLIC PANEL WITH GFR      Status: Abnormal   Collection Time: 02/21/19  8:09 AM  Result Value Ref Range   Glucose, Bld 103 (H) 65 - 99 mg/dL    Comment: .            Fasting reference interval . For someone without known diabetes, a glucose value between 100 and 125 mg/dL is consistent with prediabetes and should be confirmed with a follow-up test. .    BUN 16 7 - 25 mg/dL   Creat 0.83 0.50 - 1.10 mg/dL   GFR, Est Non African American 83 > OR = 60 mL/min/1.76m2   GFR, Est African American 97 > OR = 60 mL/min/1.39m2   BUN/Creatinine Ratio NOT APPLICABLE 6 - 22 (calc)   Sodium 139 135 - 146 mmol/L   Potassium 4.2 3.5 - 5.3 mmol/L   Chloride 104 98 - 110 mmol/L   CO2 26 20 - 32 mmol/L   Calcium 9.5 8.6 - 10.2 mg/dL   Total Protein 7.1 6.1 - 8.1 g/dL   Albumin 4.6 3.6 - 5.1 g/dL   Globulin 2.5 1.9 - 3.7 g/dL (calc)   AG Ratio 1.8 1.0 - 2.5 (calc)   Total Bilirubin 0.5 0.2 - 1.2 mg/dL   Alkaline phosphatase (APISO) 69 31 - 125 U/L   AST 22 10 - 35 U/L   ALT 25 6 - 29 U/L  Lipid panel     Status: Abnormal   Collection Time: 02/21/19  8:09 AM  Result Value Ref Range   Cholesterol 236 (H) <200 mg/dL   HDL 42 (L) > OR = 50 mg/dL   Triglycerides 222 (H) <150 mg/dL    Comment: . If a non-fasting specimen was collected, consider repeat triglyceride testing on  a fasting specimen if clinically indicated.  Yates Decamp et al. J. of Clin. Lipidol. 5284;1:324-401. Marland Kitchen    LDL Cholesterol (Calc) 156 (H) mg/dL (calc)    Comment: Reference range: <100 . Desirable range <100 mg/dL for primary prevention;   <70 mg/dL for patients with CHD or diabetic patients  with > or = 2 CHD risk factors. Marland Kitchen LDL-C is now calculated using the Martin-Hopkins  calculation, which is a validated novel method providing  better accuracy than the Friedewald equation in the  estimation of LDL-C.  Cresenciano Genre et al. Annamaria Helling. 0272;536(64): 2061-2068  (http://education.[facility].com/faq/FAQ164)    Total CHOL/HDL Ratio 5.6 (H) <5.0 (calc)    Non-HDL Cholesterol (Calc) 194 (H) <130 mg/dL (calc)    Comment: For patients with diabetes plus 1 major ASCVD risk  factor, treating to a non-HDL-C goal of <100 mg/dL  (LDL-C of <70 mg/dL) is considered a therapeutic  option.       PHQ2/9: Depression screen [facility] 2/9 02/28/2019 07/30/2018 01/06/2018 10/08/2017 09/18/2016  Decreased Interest 0 0 0 1 0  Down, Depressed, Hopeless 0 0 0 1 0  PHQ - 2 Score 0 0 0 2 0  Altered sleeping 1 2 0 3 -  Tired, decreased energy 1 2 2 3  -  Change in appetite 0 0 0 0 -  Feeling bad or failure about yourself  0 0 0 0 -  Trouble concentrating 0 0 0 0 -  Moving slowly or fidgety/restless 0 0 0 0 -  Suicidal thoughts 0 0 0 0 -  PHQ-9 Score 2 4 2 8  -  Difficult doing work/chores Not difficult at all Not difficult at all Not difficult at all Not difficult at all -    phq 9 is negative   Fall Risk: Fall Risk  02/28/2019 07/30/2018 01/06/2018 10/08/2017 09/18/2016  Falls in the past year? 0 0 No No No  Number falls in past yr: 0 - - - -  Injury with Fall? 0 - - - -     Functional Status Survey: Is the patient deaf or have difficulty hearing?: No Does the patient have difficulty seeing, even when wearing glasses/contacts?: Yes Does the patient have difficulty concentrating, remembering, or making decisions?: No Does the patient have difficulty walking or climbing stairs?: No Does the patient have difficulty dressing or bathing?: No Does the patient have difficulty doing errands alone such as visiting a doctor's office or shopping?: No    Assessment & Plan  1. GAD (generalized anxiety disorder)  - venlafaxine XR (EFFEXOR-XR) 150 MG 24 hr capsule; Take 1 capsule (150 mg total) by mouth daily with breakfast.  Dispense: 90 capsule; Refill: 1  2. Mild depression (HCC)  - venlafaxine XR (EFFEXOR-XR) 150 MG 24 hr capsule; Take 1 capsule (150 mg total) by mouth daily with breakfast.  Dispense: 90 capsule; Refill: 1  3. Hypertriglyceridemia  - omega-3  acid ethyl esters (LOVAZA) 1 g capsule; Take 1 capsule (1 g total) by mouth 2 (two) times daily.  Dispense: 360 capsule; Refill: 1  4. Migraine without aura, intractable, without status migrainosus  - atenolol (TENORMIN) 25 MG tablet; Take 1 tablet (25 mg total) by mouth daily.  Dispense: 90 tablet; Refill: 1  5. Dyslipidemia  - omega-3 acid ethyl esters (LOVAZA) 1 g capsule; Take 1 capsule (1 g total) by mouth 2 (two) times daily.  Dispense: 360 capsule; Refill: 1  6. Low HDL (under 40)   7. Elevated BP without diagnosis of hypertension  - atenolol (  TENORMIN) 25 MG tablet; Take 1 tablet (25 mg total) by mouth daily.  Dispense: 90 tablet; Refill: 1  8. Family history of melanoma  - Ambulatory referral to Dermatology

## 2019-03-04 ENCOUNTER — Other Ambulatory Visit: Payer: Self-pay

## 2019-03-04 DIAGNOSIS — R6889 Other general symptoms and signs: Secondary | ICD-10-CM | POA: Diagnosis not present

## 2019-03-04 DIAGNOSIS — Z20822 Contact with and (suspected) exposure to covid-19: Secondary | ICD-10-CM

## 2019-03-05 LAB — NOVEL CORONAVIRUS, NAA: SARS-CoV-2, NAA: NOT DETECTED

## 2019-03-28 ENCOUNTER — Other Ambulatory Visit: Payer: Self-pay | Admitting: Family Medicine

## 2019-03-28 DIAGNOSIS — E785 Hyperlipidemia, unspecified: Secondary | ICD-10-CM

## 2019-03-28 DIAGNOSIS — E781 Pure hyperglyceridemia: Secondary | ICD-10-CM

## 2019-04-11 ENCOUNTER — Other Ambulatory Visit: Payer: Self-pay

## 2019-04-11 ENCOUNTER — Ambulatory Visit (INDEPENDENT_AMBULATORY_CARE_PROVIDER_SITE_OTHER): Payer: BC Managed Care – PPO

## 2019-04-11 DIAGNOSIS — Z23 Encounter for immunization: Secondary | ICD-10-CM

## 2019-04-27 DIAGNOSIS — M8580 Other specified disorders of bone density and structure, unspecified site: Secondary | ICD-10-CM | POA: Insufficient documentation

## 2019-05-02 DIAGNOSIS — Z1231 Encounter for screening mammogram for malignant neoplasm of breast: Secondary | ICD-10-CM | POA: Diagnosis not present

## 2019-05-02 DIAGNOSIS — Z01419 Encounter for gynecological examination (general) (routine) without abnormal findings: Secondary | ICD-10-CM | POA: Diagnosis not present

## 2019-05-02 DIAGNOSIS — Z6827 Body mass index (BMI) 27.0-27.9, adult: Secondary | ICD-10-CM | POA: Diagnosis not present

## 2019-05-02 LAB — HM PAP SMEAR: HM Pap smear: NORMAL

## 2019-08-17 ENCOUNTER — Other Ambulatory Visit: Payer: Self-pay | Admitting: Family Medicine

## 2019-08-17 DIAGNOSIS — R030 Elevated blood-pressure reading, without diagnosis of hypertension: Secondary | ICD-10-CM

## 2019-08-17 DIAGNOSIS — G43019 Migraine without aura, intractable, without status migrainosus: Secondary | ICD-10-CM

## 2019-09-01 ENCOUNTER — Ambulatory Visit (INDEPENDENT_AMBULATORY_CARE_PROVIDER_SITE_OTHER): Payer: BC Managed Care – PPO | Admitting: Family Medicine

## 2019-09-01 ENCOUNTER — Other Ambulatory Visit: Payer: Self-pay

## 2019-09-01 ENCOUNTER — Encounter: Payer: Self-pay | Admitting: Family Medicine

## 2019-09-01 VITALS — BP 122/73 | Temp 97.7°F | Ht 63.0 in | Wt 154.0 lb

## 2019-09-01 DIAGNOSIS — B373 Candidiasis of vulva and vagina: Secondary | ICD-10-CM | POA: Diagnosis not present

## 2019-09-01 DIAGNOSIS — F325 Major depressive disorder, single episode, in full remission: Secondary | ICD-10-CM

## 2019-09-01 DIAGNOSIS — F411 Generalized anxiety disorder: Secondary | ICD-10-CM

## 2019-09-01 DIAGNOSIS — B3731 Acute candidiasis of vulva and vagina: Secondary | ICD-10-CM

## 2019-09-01 DIAGNOSIS — G43019 Migraine without aura, intractable, without status migrainosus: Secondary | ICD-10-CM | POA: Diagnosis not present

## 2019-09-01 DIAGNOSIS — E785 Hyperlipidemia, unspecified: Secondary | ICD-10-CM

## 2019-09-01 MED ORDER — FLUCONAZOLE 150 MG PO TABS: 150.0000 mg | ORAL_TABLET | ORAL | 0 refills | Status: DC

## 2019-09-01 MED ORDER — VENLAFAXINE HCL ER 150 MG PO CP24: 150.0000 mg | ORAL_CAPSULE | Freq: Every day | ORAL | 1 refills | Status: DC

## 2019-09-01 MED ORDER — ATENOLOL 25 MG PO TABS: 25.0000 mg | ORAL_TABLET | Freq: Every day | ORAL | 0 refills | Status: DC

## 2019-09-01 NOTE — Progress Notes (Signed)
Name: Mikayla Fritz   MRN: [id number]    DOB: 07/15/1970   Date:09/01/2019       Progress Note  Subjective  Chief Complaint  Chief Complaint  Patient presents with  . Follow-up    3 month follow up  . Depression  . Dyslipidemia    I connected with  Cristino Martes  on 09/01/19 at  9:00 AM EST by a video enabled telemedicine application and verified that I am speaking with the correct person using two identifiers.  I discussed the limitations of evaluation and management by telemedicine and the availability of in person appointments. The patient expressed understanding and agreed to proceed. Staff also discussed with the patient that there may be a patient responsible charge related to this service. Patient Location: at home Provider Location: at home   HPI  Migraine headache: she is doing very well since started on Atenolol Summer 2020. Migraines used to be prior to her cycles 4-5/10 associated with nausea , phonophobia and photophobia, usually frontal sometimes nuchal and throbbing. No episodes in months  Major Depression and GAD:she tried going down from Effexor 150 to 75 but got irritable,she is doing well on current regiment. She denies side effects of medication. She feels like she is remission.She only has difficulty falling asleep at times , she states that very sensitive to caffeine intake and sugar and has been eating healthier and is doing better. She asked if it is safe to stay on medication and I told her it is   Dyslipidemia: low HDL and high triglycerides,reviewed labs , she has been cooking more avoiding fast food , and lipid panel improved since last visit, however LDL still elevated, but low ASCVD She is on Lovaza . She has not added physical activity yet  Prediabetes: based on increase in abdominal girth, elevated bp on her last visit , also high triglycerides and low HDL , discussed life style modification and she states she has been cutting down on junk food and  substituting for vegetables. Also not eating out as often. Avoiding sweets   Skin lesion: she had noticed a bump on right upper back in 2020, seems to itch, no redness or bleeding, concerned because mother has a history of melanoma. She was given a referral to Dermatologist but she had to cancel appointment but advised her to re-schedule    Patient Active Problem List   Diagnosis Date Noted  . Thrombocytosis ([place]) 07/30/2018  . Migraine without aura, intractable, without status migrainosus 10/08/2017  . Mild depression ([place]) 10/08/2017  . Low HDL (under 40) 09/18/2016  . GERD without esophagitis 06/19/2016  . Hypertriglyceridemia 06/19/2016  . Recurrent cystitis 06/19/2016  . External hemorrhoids 08/05/2007  . Perennial allergic rhinitis 08/05/2007  . History of gastritis 08/05/2007  . GAD (generalized anxiety disorder) 08/05/2007    Past Surgical History:  Procedure Laterality Date  . TONSILLECTOMY  2002    Family History  Problem Relation Age of Onset  . Heart disease Mother   . Cancer Mother        Kidney  . Heart attack Mother        3  . Arthritis Mother   . Thyroid disease Mother   . Hypertension Father   . Diabetes Father   . Anxiety disorder Brother   . Cancer Maternal Grandmother        Breast      Tobacco Use: Low Risk   . Smoking Tobacco Use: Never Smoker  . Smokeless Tobacco  Use: Never Used   Social History   Substance and Sexual Activity  Alcohol Use No    Current Outpatient Medications:  .  atenolol (TENORMIN) 25 MG tablet, TAKE 1 TABLET BY MOUTH EVERY DAY, Disp: 90 tablet, Rfl: 0 .  omega-3 acid ethyl esters (LOVAZA) 1 g capsule, TAKE 1 CAPSULE BY MOUTH TWICE A DAY, Disp: 120 capsule, Rfl: 4 .  ondansetron (ZOFRAN) 4 MG tablet, Take 1 tablet (4 mg total) by mouth every 8 (eight) hours as needed for nausea or vomiting., Disp: 20 tablet, Rfl: 0 .  SUMAtriptan (IMITREX) 100 MG tablet, Take 1 tablet (100 mg total) by mouth every 2 (two) hours as  needed for migraine. May repeat in 2 hours if headache persists or recurs., Disp: 10 tablet, Rfl: 2 .  venlafaxine XR (EFFEXOR-XR) 150 MG 24 hr capsule, Take 1 capsule (150 mg total) by mouth daily with breakfast., Disp: 90 capsule, Rfl: 1  Allergies  Allergen Reactions  . Clindamycin/Lincomycin Itching, Nausea Only and Other (See Comments)    Stomach aches and pain  . Codeine     REACTION: makes feel loopy    I personally reviewed active problem list, medication list, allergies, family history, social history, health maintenance with the patient/caregiver today.   ROS  Ten systems reviewed and is negative except as mentioned in HPI   Objective  Virtual encounter, vitals not obtained.  Body mass index is 27.01 kg/m.  Physical Exam  Awake, alert and oriented, in no distress  PHQ2/9: Depression screen [facility] 2/9 09/01/2019 02/28/2019 07/30/2018 01/06/2018 10/08/2017  Decreased Interest 0 0 0 0 1  Down, Depressed, Hopeless 0 0 0 0 1  PHQ - 2 Score 0 0 0 0 2  Altered sleeping 1 1 2  0 3  Tired, decreased energy 1 1 2 2 3   Change in appetite 0 0 0 0 0  Feeling bad or failure about yourself  0 0 0 0 0  Trouble concentrating 0 0 0 0 0  Moving slowly or fidgety/restless 0 0 0 0 0  Suicidal thoughts 0 0 0 0 0  PHQ-9 Score 2 2 4 2 8   Difficult doing work/chores Not difficult at all Not difficult at all Not difficult at all Not difficult at all Not difficult at all   PHQ-2/9 Result is negative.    Fall Risk: Fall Risk  09/01/2019 02/28/2019 07/30/2018 01/06/2018 10/08/2017  Falls in the past year? 0 0 0 No No  Number falls in past yr: 0 0 - - -  Injury with Fall? 0 0 - - -     Assessment & Plan  1. GAD (generalized anxiety disorder)  - venlafaxine XR (EFFEXOR-XR) 150 MG 24 hr capsule; Take 1 capsule (150 mg total) by mouth daily with breakfast.  Dispense: 90 capsule; Refill: 1  2. Major  Depression in remission ([place])  - venlafaxine XR (EFFEXOR-XR) 150 MG 24 hr capsule; Take 1  capsule (150 mg total) by mouth daily with breakfast.  Dispense: 90 capsule; Refill: 1  3. Migraine without aura, intractable, without status migrainosus  - atenolol (TENORMIN) 25 MG tablet; Take 1 tablet (25 mg total) by mouth daily.  Dispense: 90 tablet; Refill: 0  4. Yeast vaginitis  From recent antibiotics use from tooth abscess  - fluconazole (DIFLUCAN) 150 MG tablet; Take 1 tablet (150 mg total) by mouth every other day.  Dispense: 3 tablet; Refill: 0  5. Dyslipidemia  Continue medication and recheck labs during her CPE  I  discussed the assessment and treatment plan with the patient. The patient was provided an opportunity to ask questions and all were answered. The patient agreed with the plan and demonstrated an understanding of the instructions.  The patient was advised to call back or seek an in-person evaluation if the symptoms worsen or if the condition fails to improve as anticipated.  I provided 25 minutes of non-face-to-face time during this encounter.

## 2019-09-14 ENCOUNTER — Telehealth: Payer: Self-pay

## 2019-09-14 NOTE — Telephone Encounter (Signed)
Copied from [place] [id number]. Topic: Appointment Scheduling - Scheduling Inquiry for Clinic >> Sep 13, 2019  5:03 PM Greggory Keen D wrote: Reason for CRM: Pt called and cancelled her appt for the CPE.  She said she had a virtual with Dr. Bedelia Person a couple weeks ago and was told she just needed to get labs done.  Please advise [phone]

## 2019-09-15 ENCOUNTER — Encounter: Payer: BC Managed Care – PPO | Admitting: Family Medicine

## 2019-11-07 ENCOUNTER — Other Ambulatory Visit: Payer: Self-pay

## 2019-11-07 ENCOUNTER — Ambulatory Visit (INDEPENDENT_AMBULATORY_CARE_PROVIDER_SITE_OTHER): Payer: BC Managed Care – PPO | Admitting: Dermatology

## 2019-11-07 DIAGNOSIS — D224 Melanocytic nevi of scalp and neck: Secondary | ICD-10-CM | POA: Diagnosis not present

## 2019-11-07 DIAGNOSIS — D1800 Hemangioma unspecified site: Secondary | ICD-10-CM

## 2019-11-07 DIAGNOSIS — D485 Neoplasm of uncertain behavior of skin: Secondary | ICD-10-CM | POA: Diagnosis not present

## 2019-11-07 DIAGNOSIS — D239 Other benign neoplasm of skin, unspecified: Secondary | ICD-10-CM

## 2019-11-07 DIAGNOSIS — L821 Other seborrheic keratosis: Secondary | ICD-10-CM | POA: Diagnosis not present

## 2019-11-07 DIAGNOSIS — Z808 Family history of malignant neoplasm of other organs or systems: Secondary | ICD-10-CM

## 2019-11-07 DIAGNOSIS — D489 Neoplasm of uncertain behavior, unspecified: Secondary | ICD-10-CM

## 2019-11-07 DIAGNOSIS — D2272 Melanocytic nevi of left lower limb, including hip: Secondary | ICD-10-CM | POA: Diagnosis not present

## 2019-11-07 DIAGNOSIS — Z1283 Encounter for screening for malignant neoplasm of skin: Secondary | ICD-10-CM | POA: Diagnosis not present

## 2019-11-07 DIAGNOSIS — L578 Other skin changes due to chronic exposure to nonionizing radiation: Secondary | ICD-10-CM

## 2019-11-07 DIAGNOSIS — D229 Melanocytic nevi, unspecified: Secondary | ICD-10-CM | POA: Diagnosis not present

## 2019-11-07 DIAGNOSIS — L814 Other melanin hyperpigmentation: Secondary | ICD-10-CM

## 2019-11-07 DIAGNOSIS — D225 Melanocytic nevi of trunk: Secondary | ICD-10-CM | POA: Diagnosis not present

## 2019-11-07 DIAGNOSIS — D2261 Melanocytic nevi of right upper limb, including shoulder: Secondary | ICD-10-CM | POA: Diagnosis not present

## 2019-11-07 HISTORY — DX: Other benign neoplasm of skin, unspecified: D23.9

## 2019-11-07 NOTE — Progress Notes (Signed)
New Patient Visit  Subjective  Mikayla Fritz is a 49 y.o. female who presents for the following: TBSE (Mother has a hx of MM in 1999 on her face. Patient has an itchy spot that sends shoting pains up her neck. PCP suggested to have it looked at. Patient has no hx of any Skin Ca).  The patient presents for skin cancer screening, mole check and total-body skin examination today.  The following portions of the chart were reviewed this encounter and updated as appropriate:  Tobacco  Allergies  Meds  Problems  Med Hx  Surg Hx  Fam Hx      Review of Systems:  No other skin or systemic complaints except as noted in HPI or Assessment and Plan.  Objective  Well appearing patient in no apparent distress; mood and affect are within normal limits.  A full examination was performed including scalp, head, eyes, ears, nose, lips, neck, chest, axillae, abdomen, back, buttocks, bilateral upper extremities, bilateral lower extremities, hands, feet, fingers, toes, fingernails, and toenails. All findings within normal limits unless otherwise noted below.  Objective  Right Inferior Scapula: 0.5 cm x 0.3cm irregular round brown macule  Objective  right neck: 0.3cm brown papule  Objective  Right superior buttock crease: 0.6cm brown papule  Objective  Left med to sup medial buttock: 0.6cm brown papule  Objective  Left medial thigh: 0.4cm brown papule   Assessment & Plan  Neoplasm of uncertain behavior (5) Right Inferior Scapula  Epidermal / dermal shaving  Lesion length (cm):  0.5 Lesion width (cm):  0.3 Margin per side (cm):  0.2 Total excision diameter (cm):  0.9 Informed consent: discussed and consent obtained   Timeout: patient name, date of birth, surgical site, and procedure verified   Procedure prep:  Patient was prepped and draped in usual sterile fashion Prep type:  Isopropyl alcohol Anesthesia: the lesion was anesthetized in a standard fashion   Anesthetic:  1%  lidocaine w/ epinephrine 1-100,000 buffered w/ 8.4% NaHCO3 Instrument used: flexible razor blade   Hemostasis achieved with: pressure, aluminum chloride and electrodesiccation   Outcome: patient tolerated procedure well   Post-procedure details: sterile dressing applied and wound care instructions given   Dressing type: bandage and petrolatum    Specimen 1 - Surgical pathology Differential Diagnosis: R/O Nevus vs dysplastic  Check Margins: No 0.5 cm x 0.3cm irregular round brown macule  right neck  Epidermal / dermal shaving  Lesion length (cm):  0.3 Lesion width (cm):  0.3 Margin per side (cm):  0.2 Total excision diameter (cm):  0.7 Informed consent: discussed and consent obtained   Timeout: patient name, date of birth, surgical site, and procedure verified   Anesthesia: the lesion was anesthetized in a standard fashion   Anesthetic:  1% lidocaine w/ epinephrine 1-100,000 buffered w/ 8.4% NaHCO3 Instrument used: flexible razor blade   Outcome: patient tolerated procedure well   Post-procedure details: sterile dressing applied and wound care instructions given   Dressing type: petrolatum and pressure dressing    Specimen 4 - Surgical pathology Differential Diagnosis: R/O Nevus vs dysplastic Check Margins: No 0.3cm brown papule  Right superior buttock crease  Epidermal / dermal shaving  Lesion length (cm):  0.6 Lesion width (cm):  0.6 Margin per side (cm):  0.2 Total excision diameter (cm):  1 Informed consent: discussed and consent obtained   Timeout: patient name, date of birth, surgical site, and procedure verified   Procedure prep:  Patient was prepped and draped in usual  sterile fashion Prep type:  Isopropyl alcohol Anesthesia: the lesion was anesthetized in a standard fashion   Anesthetic:  1% lidocaine w/ epinephrine 1-100,000 buffered w/ 8.4% NaHCO3 Instrument used: flexible razor blade   Hemostasis achieved with: pressure, aluminum chloride and  electrodesiccation   Outcome: patient tolerated procedure well   Post-procedure details: sterile dressing applied and wound care instructions given   Dressing type: bandage and petrolatum    Specimen 2 - Surgical pathology Differential Diagnosis: R/O Nevus vs dysplastic Check Margins: No  Left med to sup medial buttock  Epidermal / dermal shaving  Lesion length (cm):  0.6 Lesion width (cm):  0.6 Margin per side (cm):  0.2 Total excision diameter (cm):  1 Informed consent: discussed and consent obtained   Timeout: patient name, date of birth, surgical site, and procedure verified   Procedure prep:  Patient was prepped and draped in usual sterile fashion Prep type:  Isopropyl alcohol Anesthesia: the lesion was anesthetized in a standard fashion   Anesthetic:  1% lidocaine w/ epinephrine 1-100,000 buffered w/ 8.4% NaHCO3 Instrument used: flexible razor blade   Hemostasis achieved with: pressure, aluminum chloride and electrodesiccation   Outcome: patient tolerated procedure well   Post-procedure details: sterile dressing applied and wound care instructions given   Dressing type: bandage and petrolatum    Specimen 3 - Surgical pathology Differential Diagnosis: R/O Nevus vs dysplastic Check Margins: No 0.6cm brown papule  Left medial thigh  Epidermal / dermal shaving  Lesion length (cm):  0.4 Lesion width (cm):  0.4 Margin per side (cm):  0.2 Total excision diameter (cm):  0.8 Informed consent: discussed and consent obtained   Timeout: patient name, date of birth, surgical site, and procedure verified   Procedure prep:  Patient was prepped and draped in usual sterile fashion Prep type:  Isopropyl alcohol Anesthesia: the lesion was anesthetized in a standard fashion   Anesthetic:  1% lidocaine w/ epinephrine 1-100,000 buffered w/ 8.4% NaHCO3 Instrument used: flexible razor blade   Hemostasis achieved with: pressure, aluminum chloride and electrodesiccation   Outcome:  patient tolerated procedure well   Post-procedure details: sterile dressing applied and wound care instructions given   Dressing type: bandage and petrolatum    Specimen 5 - Surgical pathology Differential Diagnosis:R/O Nevus vs dysplastic Check Margins: No 0.4cm brown papule  R/O  nevus vs dysplastic    Skin cancer screening performed today.    Actinic Damage - diffuse scaly erythematous macules with underlying dyspigmentation - Recommend daily broad spectrum sunscreen SPF 30+ to sun-exposed areas, reapply every 2 hours as needed.  - Call for new or changing lesions.  Melanocytic Nevi - Tan-brown and/or pink-flesh-colored symmetric macules and papules - Benign appearing on exam today - Observation - Call clinic for new or changing moles - Recommend daily use of broad spectrum spf 30+ sunscreen to sun-exposed areas.   Seborrheic Keratoses - Stuck-on, waxy, tan-brown papules and plaques  - Discussed benign etiology and prognosis. - Observe - Call for any changes  Hemangiomas - Red papules - Discussed benign nature - Observe - Call for any changes  Lentigines - Scattered tan macules - Discussed due to sun exposure - Benign, observe - Call for any changes  Return in about 1 year (around 11/06/2020) for TBSE.   I, Donzetta Kohut, CMA, am acting as scribe for Sarina Ser, MD  Documentation: I have reviewed the above documentation for accuracy and completeness, and I agree with the above.  Sarina Ser, MD

## 2019-11-07 NOTE — Patient Instructions (Addendum)

## 2019-11-08 ENCOUNTER — Encounter: Payer: Self-pay | Admitting: Dermatology

## 2019-11-09 ENCOUNTER — Other Ambulatory Visit: Payer: Self-pay | Admitting: Family Medicine

## 2019-11-09 ENCOUNTER — Encounter: Payer: Self-pay | Admitting: Family Medicine

## 2019-11-09 DIAGNOSIS — G43019 Migraine without aura, intractable, without status migrainosus: Secondary | ICD-10-CM

## 2019-11-09 NOTE — Telephone Encounter (Signed)
Requested Prescriptions  Pending Prescriptions Disp Refills  . atenolol (TENORMIN) 25 MG tablet [Pharmacy Med Name: ATENOLOL 25 MG TABLET] 90 tablet 1    Sig: TAKE 1 TABLET BY MOUTH EVERY DAY     Cardiovascular:  Beta Blockers Passed - 11/09/2019  3:21 AM      Passed - Last BP in normal range    BP Readings from Last 1 Encounters:  09/01/19 122/73         Passed - Last Heart Rate in normal range    Pulse Readings from Last 1 Encounters:  02/28/19 92         Passed - Valid encounter within last 6 months    Recent Outpatient Visits          2 months ago Yeast vaginitis   [facility] Steele Sizer, MD   8 months ago Hypertriglyceridemia   [facility] Steele Sizer, MD   1 year ago Mild depression [facility])   [facility] Steele Sizer, MD   1 year ago Mild depression [facility])   [facility], Drue Stager, MD   2 years ago Migraine without aura, intractable, without status migrainosus   [facility] Steele Sizer, MD

## 2019-11-10 ENCOUNTER — Telehealth: Payer: Self-pay

## 2019-11-10 NOTE — Telephone Encounter (Signed)
Patient was informed that of her biopsy results, verbalized understanding and will check schedule and reschedule her follow appt for a closer appt time

## 2020-01-19 ENCOUNTER — Other Ambulatory Visit: Payer: Self-pay | Admitting: Family Medicine

## 2020-01-19 DIAGNOSIS — E781 Pure hyperglyceridemia: Secondary | ICD-10-CM

## 2020-01-19 DIAGNOSIS — E785 Hyperlipidemia, unspecified: Secondary | ICD-10-CM

## 2020-02-19 ENCOUNTER — Telehealth: Payer: Self-pay | Admitting: Family Medicine

## 2020-02-19 DIAGNOSIS — F411 Generalized anxiety disorder: Secondary | ICD-10-CM

## 2020-02-19 DIAGNOSIS — F325 Major depressive disorder, single episode, in full remission: Secondary | ICD-10-CM

## 2020-02-19 NOTE — Telephone Encounter (Signed)
Requested medication (s) are due for refill today: yes  Requested medication (s) are on the active medication list: yes  Last refill:  09/01/19 #90 with 1 refill  Future visit scheduled: no  Notes to clinic:  Please review for refill. Last refill notes that patient needs a follow up appt. No follow up scheduled at this time.    Requested Prescriptions  Pending Prescriptions Disp Refills   venlafaxine XR (EFFEXOR-XR) 150 MG 24 hr capsule [Pharmacy Med Name: VENLAFAXINE HCL ER 150 MG CAP] 90 capsule 1    Sig: Take 1 capsule (150 mg total) by mouth daily with breakfast.      Psychiatry: Antidepressants - SNRI - desvenlafaxine & venlafaxine Failed - 02/19/2020 12:58 AM      Failed - LDL in normal range and within 360 days    LDL Cholesterol (Calc)  Date Value Ref Range Status  02/21/2019 156 (H) mg/dL (calc) Final    Comment:    Reference range: <100 . Desirable range <100 mg/dL for primary prevention;   <70 mg/dL for patients with CHD or diabetic patients  with > or = 2 CHD risk factors. Marland Kitchen LDL-C is now calculated using the Martin-Hopkins  calculation, which is a validated novel method providing  better accuracy than the Friedewald equation in the  estimation of LDL-C.  Cresenciano Genre et al. Annamaria Helling. 2423;536(14): 2061-2068  (http://education.[facility].com/faq/FAQ164)           Failed - Total Cholesterol in normal range and within 360 days    Cholesterol  Date Value Ref Range Status  02/21/2019 236 (H) <200 mg/dL Final          Failed - Triglycerides in normal range and within 360 days    Triglycerides  Date Value Ref Range Status  02/21/2019 222 (H) <150 mg/dL Final    Comment:    . If a non-fasting specimen was collected, consider repeat triglyceride testing on a fasting specimen if clinically indicated.  Yates Decamp et al. J. of Clin. Lipidol. 4315;4:008-676. Marland Kitchen           Passed - Completed PHQ-2 or PHQ-9 in the last 360 days.      Passed - Last BP in normal  range    BP Readings from Last 1 Encounters:  09/01/19 122/73          Passed - Valid encounter within last 6 months    Recent Outpatient Visits           5 months ago Yeast vaginitis   [facility] Steele Sizer, MD   11 months ago Hypertriglyceridemia   [facility] Steele Sizer, MD   1 year ago Mild depression [facility])   [facility] Steele Sizer, MD   2 years ago Mild depression [facility])   [facility] Steele Sizer, MD   2 years ago Migraine without aura, intractable, without status migrainosus   [facility] Steele Sizer, MD

## 2020-02-21 NOTE — Telephone Encounter (Signed)
appt scheduled with Dr Ancil Boozer for 8.11.2021

## 2020-02-22 ENCOUNTER — Other Ambulatory Visit: Payer: Self-pay

## 2020-02-22 ENCOUNTER — Encounter: Payer: Self-pay | Admitting: Family Medicine

## 2020-02-22 ENCOUNTER — Ambulatory Visit (INDEPENDENT_AMBULATORY_CARE_PROVIDER_SITE_OTHER): Payer: BC Managed Care – PPO | Admitting: Family Medicine

## 2020-02-22 VITALS — BP 132/80 | HR 102 | Temp 97.8°F | Resp 16 | Ht 63.0 in | Wt 152.3 lb

## 2020-02-22 DIAGNOSIS — E781 Pure hyperglyceridemia: Secondary | ICD-10-CM

## 2020-02-22 DIAGNOSIS — Z79899 Other long term (current) drug therapy: Secondary | ICD-10-CM | POA: Diagnosis not present

## 2020-02-22 DIAGNOSIS — G43019 Migraine without aura, intractable, without status migrainosus: Secondary | ICD-10-CM | POA: Diagnosis not present

## 2020-02-22 DIAGNOSIS — F411 Generalized anxiety disorder: Secondary | ICD-10-CM

## 2020-02-22 DIAGNOSIS — F325 Major depressive disorder, single episode, in full remission: Secondary | ICD-10-CM | POA: Diagnosis not present

## 2020-02-22 DIAGNOSIS — Z1159 Encounter for screening for other viral diseases: Secondary | ICD-10-CM

## 2020-02-22 DIAGNOSIS — Z1231 Encounter for screening mammogram for malignant neoplasm of breast: Secondary | ICD-10-CM

## 2020-02-22 DIAGNOSIS — J4520 Mild intermittent asthma, uncomplicated: Secondary | ICD-10-CM

## 2020-02-22 DIAGNOSIS — R7303 Prediabetes: Secondary | ICD-10-CM

## 2020-02-22 DIAGNOSIS — Z1211 Encounter for screening for malignant neoplasm of colon: Secondary | ICD-10-CM

## 2020-02-22 DIAGNOSIS — E559 Vitamin D deficiency, unspecified: Secondary | ICD-10-CM

## 2020-02-22 DIAGNOSIS — E785 Hyperlipidemia, unspecified: Secondary | ICD-10-CM

## 2020-02-22 MED ORDER — OMEGA-3-ACID ETHYL ESTERS 1 G PO CAPS: 1.0000 | ORAL_CAPSULE | Freq: Two times a day (BID) | ORAL | 1 refills | Status: DC

## 2020-02-22 MED ORDER — ALBUTEROL SULFATE HFA 108 (90 BASE) MCG/ACT IN AERS: 2.0000 | INHALATION_SPRAY | Freq: Four times a day (QID) | RESPIRATORY_TRACT | 0 refills | Status: DC | PRN

## 2020-02-22 MED ORDER — VENLAFAXINE HCL ER 150 MG PO CP24: 150.0000 mg | ORAL_CAPSULE | Freq: Every day | ORAL | 1 refills | Status: DC

## 2020-02-22 NOTE — Progress Notes (Signed)
Name: Mikayla Fritz   MRN: [id number]    DOB: Jul 06, 1971   Date:02/22/2020       Progress Note  Subjective  Chief Complaint  Chief Complaint  Patient presents with  . Respiratory Distress    She has had some epidsodes with having trouble breathing. She has had to borrow her dads inhaler and it does relieve her symptoms.    HPI   Migraine headache: she is doing very well since started on Atenolol Summer 2020. Migraines used to be prior to her cycles 4-5/10 associated with nausea , phonophobia and photophobia, usually frontal sometimes nuchal and throbbing. No episodes since started on Atenolol   Major Depression and GAD:she tried going down from Effexor 150 to 75 but got irritable,she is doing well on current regiment. She denies side effects of medication. She feels like she is remission.She only has difficulty falling asleep at times , she states that very sensitive to caffeine intake and sugarand has been eating healthier and is doing better. Discussed melatonin prn. She is under more stress lately, has a puppy and also husband will have knee and hip replacement soon   Dyslipidemia: low HDL and high triglycerides,reviewed labs , she has been cooking more avoiding fast food , and lipid panel improved since last visit, however LDL still elevated, but low ASCVD She is on Lovaza but was taking only one daily and recently two daily, explained needs to take two in am and two in pm   Prediabetes: based on increase in abdominal girth, elevated bp on her last visit , also high triglycerides and low HDL , discussed life style modification and she has been eating better.   RAD: she states since childhood she has noticed problems breathing. She states she has never been evaluated for asthma. She states this week with poor air qualify she noticed some chest tightness and sob, she used her father's inhaler and symptoms resolved right away. She has intermittent wheezing and coughing when she  laughs. Not currently.    Patient Active Problem List   Diagnosis Date Noted  . Thrombocytosis ([place]) 07/30/2018  . Migraine without aura, intractable, without status migrainosus 10/08/2017  . Mild depression ([place]) 10/08/2017  . Low HDL (under 40) 09/18/2016  . GERD without esophagitis 06/19/2016  . Hypertriglyceridemia 06/19/2016  . Recurrent cystitis 06/19/2016  . External hemorrhoids 08/05/2007  . Perennial allergic rhinitis 08/05/2007  . History of gastritis 08/05/2007  . GAD (generalized anxiety disorder) 08/05/2007    Past Surgical History:  Procedure Laterality Date  . TONSILLECTOMY  2002    Family History  Problem Relation Age of Onset  . Heart disease Mother   . Cancer Mother        Kidney  . Heart attack Mother        3  . Arthritis Mother   . Thyroid disease Mother   . Hypertension Father   . Diabetes Father   . Anxiety disorder Brother   . Cancer Maternal Grandmother        Breast    Social History   Tobacco Use  . Smoking status: Never Smoker  . Smokeless tobacco: Never Used  Substance Use Topics  . Alcohol use: No     Current Outpatient Medications:  .  atenolol (TENORMIN) 25 MG tablet, TAKE 1 TABLET BY MOUTH EVERY DAY, Disp: 90 tablet, Rfl: 1 .  omega-3 acid ethyl esters (LOVAZA) 1 g capsule, TAKE 1 CAPSULE BY MOUTH TWICE A DAY, Disp: 180 capsule, Rfl: 0 .  ondansetron (ZOFRAN) 4 MG tablet, Take 1 tablet (4 mg total) by mouth every 8 (eight) hours as needed for nausea or vomiting., Disp: 20 tablet, Rfl: 0 .  SUMAtriptan (IMITREX) 100 MG tablet, Take 1 tablet (100 mg total) by mouth every 2 (two) hours as needed for migraine. May repeat in 2 hours if headache persists or recurs., Disp: 10 tablet, Rfl: 2 .  venlafaxine XR (EFFEXOR-XR) 150 MG 24 hr capsule, TAKE 1 CAPSULE (150 MG TOTAL) BY MOUTH DAILY WITH BREAKFAST., Disp: 30 capsule, Rfl: 0 .  fluconazole (DIFLUCAN) 150 MG tablet, Take 1 tablet (150 mg total) by mouth every other day. (Patient not  taking: Reported on 02/22/2020), Disp: 3 tablet, Rfl: 0  Allergies  Allergen Reactions  . Clindamycin/Lincomycin Itching, Nausea Only and Other (See Comments)    Stomach aches and pain  . Codeine     REACTION: makes feel loopy    I personally reviewed active problem list, medication list, allergies, family history, social history, health maintenance with the patient/caregiver today.   ROS  Constitutional: Negative for fever or weight change.  Respiratory: Negative for cough , positive for intermittent shortness of breath.   Cardiovascular: Negative for chest pain or palpitations.  Gastrointestinal: Negative for abdominal pain, no bowel changes.  Musculoskeletal: Negative for gait problem or joint swelling.  Skin: Negative for rash.  Neurological: Negative for dizziness or headache.  No other specific complaints in a complete review of systems (except as listed in HPI above).  Objective  Vitals:   02/22/20 1100  BP: 132/80  Pulse: (!) 102  Resp: 16  Temp: 97.8 F (36.6 C)  TempSrc: Oral  SpO2: 98%  Weight: 152 lb 4.8 oz (69.1 kg)  Height: 5\' 3"  (1.6 m)    Body mass index is 26.98 kg/m.  Physical Exam  Constitutional: Patient appears well-developed and well-nourished. Obese  No distress.  HEENT: head atraumatic, normocephalic, pupils equal and reactive to light, neck supple Cardiovascular: Normal rate, regular rhythm and normal heart sounds.  No murmur heard. No BLE edema. Pulmonary/Chest: Effort normal and breath sounds normal. No respiratory distress. Abdominal: Soft.  There is no tenderness. Psychiatric: Patient has a normal mood and affect. behavior is normal. Judgment and thought content normal.  PHQ2/9: Depression screen [facility] 2/9 02/22/2020 09/01/2019 02/28/2019 07/30/2018 01/06/2018  Decreased Interest 0 0 0 0 0  Down, Depressed, Hopeless 0 0 0 0 0  PHQ - 2 Score 0 0 0 0 0  Altered sleeping 1 1 1 2  0  Tired, decreased energy 2 1 1 2 2   Change in appetite 0 0 0  0 0  Feeling bad or failure about yourself  0 0 0 0 0  Trouble concentrating 0 0 0 0 0  Moving slowly or fidgety/restless 0 0 0 0 0  Suicidal thoughts 0 0 0 0 0  PHQ-9 Score 3 2 2 4 2   Difficult doing work/chores Not difficult at all Not difficult at all Not difficult at all Not difficult at all Not difficult at all    phq 9 is negative   Fall Risk: Fall Risk  02/22/2020 09/01/2019 02/28/2019 07/30/2018 01/06/2018  Falls in the past year? 0 0 0 0 No  Number falls in past yr: 0 0 0 - -  Injury with Fall? 0 0 0 - -      Assessment & Plan  1. Depression, major, in remission ([place])   2. Migraine without aura, intractable, without status migrainosus  Doing well  3. Dyslipidemia  - Lipid panel  4. Hypertriglyceridemia  - Lipid panel  5. Long-term use of high-risk medication  - CBC with Differential/Platelet - COMPLETE METABOLIC PANEL WITH GFR  6. Vitamin D deficiency  - VITAMIN D 25 Hydroxy (Vit-D Deficiency, Fractures)  7. Need for hepatitis C screening test  - Hepatitis C antibody  8. Pre-diabetes  - Hemoglobin A1c  9. Encounter for screening mammogram for malignant neoplasm of breast  - MM Digital Screening; Future  10. Mild intermittent reactive airway disease without complication

## 2020-02-23 LAB — CBC WITH DIFFERENTIAL/PLATELET
Absolute Monocytes: 650 cells/uL (ref 200–950)
Basophils Absolute: 50 cells/uL (ref 0–200)
Basophils Relative: 0.5 %
Eosinophils Absolute: 80 cells/uL (ref 15–500)
Eosinophils Relative: 0.8 %
HCT: 41.5 % (ref 35.0–45.0)
Hemoglobin: 14.1 g/dL (ref 11.7–15.5)
Lymphs Abs: 3760 cells/uL (ref 850–3900)
MCH: 30.2 pg (ref 27.0–33.0)
MCHC: 34 g/dL (ref 32.0–36.0)
MCV: 88.9 fL (ref 80.0–100.0)
MPV: 9.1 fL (ref 7.5–12.5)
Monocytes Relative: 6.5 %
Neutro Abs: 5460 cells/uL (ref 1500–7800)
Neutrophils Relative %: 54.6 %
Platelets: 365 10*3/uL (ref 140–400)
RBC: 4.67 10*6/uL (ref 3.80–5.10)
RDW: 12.6 % (ref 11.0–15.0)
Total Lymphocyte: 37.6 %
WBC: 10 10*3/uL (ref 3.8–10.8)

## 2020-02-23 LAB — COMPLETE METABOLIC PANEL WITH GFR
AG Ratio: 1.8 (calc) (ref 1.0–2.5)
ALT: 23 U/L (ref 6–29)
AST: 19 U/L (ref 10–35)
Albumin: 4.8 g/dL (ref 3.6–5.1)
Alkaline phosphatase (APISO): 71 U/L (ref 31–125)
BUN: 14 mg/dL (ref 7–25)
CO2: 23 mmol/L (ref 20–32)
Calcium: 10.3 mg/dL — ABNORMAL HIGH (ref 8.6–10.2)
Chloride: 106 mmol/L (ref 98–110)
Creat: 0.64 mg/dL (ref 0.50–1.10)
GFR, Est African American: 121 mL/min/{1.73_m2} (ref >=60)
GFR, Est Non African American: 105 mL/min/{1.73_m2} (ref >=60)
Globulin: 2.6 g/dL (calc) (ref 1.9–3.7)
Glucose, Bld: 109 mg/dL — ABNORMAL HIGH (ref 65–99)
Potassium: 4.1 mmol/L (ref 3.5–5.3)
Sodium: 139 mmol/L (ref 135–146)
Total Bilirubin: 0.3 mg/dL (ref 0.2–1.2)
Total Protein: 7.4 g/dL (ref 6.1–8.1)

## 2020-02-23 LAB — HEMOGLOBIN A1C
Hgb A1c MFr Bld: 5.4 % of total Hgb (ref <5.7)
Mean Plasma Glucose: 108 (calc)
eAG (mmol/L): 6 (calc)

## 2020-02-23 LAB — LIPID PANEL
Cholesterol: 225 mg/dL — ABNORMAL HIGH (ref <200)
HDL: 38 mg/dL — ABNORMAL LOW (ref >=50)
LDL Cholesterol (Calc): 138 mg/dL (calc) — ABNORMAL HIGH
Non-HDL Cholesterol (Calc): 187 mg/dL (calc) — ABNORMAL HIGH (ref <130)
Total CHOL/HDL Ratio: 5.9 (calc) — ABNORMAL HIGH (ref <5.0)
Triglycerides: 341 mg/dL — ABNORMAL HIGH (ref <150)

## 2020-02-23 LAB — HEPATITIS C ANTIBODY
Hepatitis C Ab: NONREACTIVE
SIGNAL TO CUT-OFF: 0.04 (ref <1.00)

## 2020-02-23 LAB — VITAMIN D 25 HYDROXY (VIT D DEFICIENCY, FRACTURES): Vit D, 25-Hydroxy: 55 ng/mL (ref 30–100)

## 2020-03-11 ENCOUNTER — Other Ambulatory Visit: Payer: Self-pay | Admitting: Family Medicine

## 2020-03-11 DIAGNOSIS — J4520 Mild intermittent asthma, uncomplicated: Secondary | ICD-10-CM

## 2020-05-02 DIAGNOSIS — Z1231 Encounter for screening mammogram for malignant neoplasm of breast: Secondary | ICD-10-CM | POA: Diagnosis not present

## 2020-05-02 DIAGNOSIS — Z01419 Encounter for gynecological examination (general) (routine) without abnormal findings: Secondary | ICD-10-CM | POA: Diagnosis not present

## 2020-05-02 DIAGNOSIS — Z6827 Body mass index (BMI) 27.0-27.9, adult: Secondary | ICD-10-CM | POA: Diagnosis not present

## 2020-05-02 LAB — HM MAMMOGRAPHY

## 2020-05-07 ENCOUNTER — Other Ambulatory Visit: Payer: Self-pay | Admitting: Obstetrics and Gynecology

## 2020-05-07 DIAGNOSIS — R928 Other abnormal and inconclusive findings on diagnostic imaging of breast: Secondary | ICD-10-CM

## 2020-05-24 ENCOUNTER — Ambulatory Visit
Admission: RE | Admit: 2020-05-24 | Discharge: 2020-05-24 | Disposition: A | Discharge status: Home / Self Care | Payer: BC Managed Care – PPO | Source: Ambulatory Visit | Attending: Obstetrics and Gynecology | Admitting: Obstetrics and Gynecology

## 2020-05-24 ENCOUNTER — Other Ambulatory Visit: Payer: Self-pay

## 2020-05-24 DIAGNOSIS — N6011 Diffuse cystic mastopathy of right breast: Secondary | ICD-10-CM | POA: Diagnosis not present

## 2020-05-24 DIAGNOSIS — R928 Other abnormal and inconclusive findings on diagnostic imaging of breast: Secondary | ICD-10-CM

## 2020-05-31 DIAGNOSIS — Z20822 Contact with and (suspected) exposure to covid-19: Secondary | ICD-10-CM | POA: Diagnosis not present

## 2020-06-09 DIAGNOSIS — Z20822 Contact with and (suspected) exposure to covid-19: Secondary | ICD-10-CM | POA: Diagnosis not present

## 2020-06-26 ENCOUNTER — Other Ambulatory Visit: Payer: Self-pay | Admitting: Obstetrics and Gynecology

## 2020-06-26 DIAGNOSIS — R928 Other abnormal and inconclusive findings on diagnostic imaging of breast: Secondary | ICD-10-CM

## 2020-08-18 ENCOUNTER — Other Ambulatory Visit: Payer: Self-pay | Admitting: Family Medicine

## 2020-08-18 DIAGNOSIS — G43019 Migraine without aura, intractable, without status migrainosus: Secondary | ICD-10-CM

## 2020-08-18 NOTE — Telephone Encounter (Signed)
Future visit in 6 days  

## 2020-08-23 NOTE — Progress Notes (Signed)
Name: Mikayla Fritz   MRN: [id number]    DOB: 03-09-1971   Date:08/24/2020       Progress Note  Subjective  Chief Complaint  Follow Up  HPI  Migraine headache: she is doing very well since started on Atenolol Summer 2020. Migraines used to be prior to her cycles and it was  associated with nausea , phonophobia and photophobia, usually frontal sometimes nuchal and throbbing. She does not like using Imitrex due to side effects, makes her loopy, we will try giving her some Nurtec to take prn if needed   Major Depression and GAD:she tried going down from Effexor 150 to 75 , but when she tried to go down on the dose she felt irritable. She only has difficulty falling asleep at times, she has been taking Melatonin and is working well for her. No side effects of medication   Dyslipidemia: low HDL and high triglycerides, LDL elevated.She is on Lovaza and has been taking 4 capsules daily , we will recheck labs today. She has been making better food choices for the home and also when she goes out to eat. Avoiding junk food.   Prediabetes: based on increase in abdominal girth, elevated bp on her last visit , also high triglycerides and low HDL. She has been eating healthier but still needs to increase physical activity   RAD: she states since childhood she has noticed problems breathing. She states she has never been evaluated for asthma. She states this week with poor air qualify she noticed some chest tightness and sob, she used her father's inhaler and symptoms resolved right away. Only needs to use Ventolin a couple times per month   Patient Active Problem List   Diagnosis Date Noted  . Osteopenia 04/27/2019  . Thrombocytosis 07/30/2018  . Migraine without aura, intractable, without status migrainosus 10/08/2017  . Mild depression ([place]) 10/08/2017  . Low HDL (under 40) 09/18/2016  . GERD without esophagitis 06/19/2016  . Hypertriglyceridemia 06/19/2016  . Recurrent cystitis 06/19/2016  .  External hemorrhoids 08/05/2007  . Perennial allergic rhinitis 08/05/2007  . History of gastritis 08/05/2007  . GAD (generalized anxiety disorder) 08/05/2007    Past Surgical History:  Procedure Laterality Date  . TONSILLECTOMY  2002    Family History  Problem Relation Age of Onset  . Heart disease Mother   . Cancer Mother        Kidney  . Heart attack Mother        3  . Arthritis Mother   . Thyroid disease Mother   . Hypertension Father   . Diabetes Father   . Anxiety disorder Brother   . Cancer Maternal Grandmother        Breast  . Breast cancer Maternal Grandmother     Social History   Tobacco Use  . Smoking status: Never Smoker  . Smokeless tobacco: Never Used  Substance Use Topics  . Alcohol use: No     Current Outpatient Medications:  .  albuterol (VENTOLIN HFA) 108 (90 Base) MCG/ACT inhaler, TAKE 2 PUFFS BY MOUTH EVERY 6 HOURS AS NEEDED FOR WHEEZE OR SHORTNESS OF BREATH, Disp: 8.5 each, Rfl: 11 .  Rimegepant Sulfate (NURTEC) 75 MG TBDP, Take 1 tablet by mouth daily as needed., Disp: 9 tablet, Rfl: 1 .  atenolol (TENORMIN) 25 MG tablet, Take 1 tablet (25 mg total) by mouth daily., Disp: 90 tablet, Rfl: 1 .  omega-3 acid ethyl esters (LOVAZA) 1 g capsule, Take 1 capsule (1 g total) by  mouth 2 (two) times daily., Disp: 360 capsule, Rfl: 1 .  ondansetron (ZOFRAN) 4 MG tablet, Take 1 tablet (4 mg total) by mouth every 8 (eight) hours as needed for nausea or vomiting., Disp: 20 tablet, Rfl: 0 .  venlafaxine XR (EFFEXOR-XR) 150 MG 24 hr capsule, Take 1 capsule (150 mg total) by mouth daily with breakfast., Disp: 90 capsule, Rfl: 1  Allergies  Allergen Reactions  . Clindamycin/Lincomycin Itching, Nausea Only and Other (See Comments)    Stomach aches and pain  . Codeine     REACTION: makes feel loopy    I personally reviewed active problem list, medication list, allergies, family history, social history, health maintenance with the patient/caregiver  today.   ROS  Constitutional: Negative for fever or weight change.  Respiratory: Negative for cough and shortness of breath.   Cardiovascular: Negative for chest pain or palpitations.  Gastrointestinal: Negative for abdominal pain, no bowel changes.  Musculoskeletal: Negative for gait problem or joint swelling.  Skin: Negative for rash.  Neurological: Negative for dizziness or headache.  No other specific complaints in a complete review of systems (except as listed in HPI above).  Objective  Vitals:   08/24/20 0839  BP: 128/70  Pulse: 92  Resp: 16  Temp: 98 F (36.7 C)  TempSrc: Oral  SpO2: 98%  Weight: 153 lb 14.4 oz (69.8 kg)  Height: 5\' 3"  (1.6 m)    Body mass index is 27.26 kg/m.  Physical Exam  Constitutional: Patient appears well-developed and well-nourished. Overweight.  No distress.  HEENT: head atraumatic, normocephalic, pupils equal and reactive to light,  neck supple Cardiovascular: Normal rate, regular rhythm and normal heart sounds.  No murmur heard. No BLE edema. Pulmonary/Chest: Effort normal and breath sounds normal. No respiratory distress. Abdominal: Soft.  There is no tenderness. Psychiatric: Patient has a normal mood and affect. behavior is normal. Judgment and thought content normal.  PHQ2/9: Depression screen [facility] 2/9 08/24/2020 02/22/2020 09/01/2019 02/28/2019 07/30/2018  Decreased Interest 0 0 0 0 0  Down, Depressed, Hopeless 0 0 0 0 0  PHQ - 2 Score 0 0 0 0 0  Altered sleeping 0 1 1 1 2   Tired, decreased energy 0 2 1 1 2   Change in appetite 0 0 0 0 0  Feeling bad or failure about yourself  0 0 0 0 0  Trouble concentrating 0 0 0 0 0  Moving slowly or fidgety/restless 0 0 0 0 0  Suicidal thoughts 0 0 0 0 0  PHQ-9 Score 0 3 2 2 4   Difficult doing work/chores - Not difficult at all Not difficult at all Not difficult at all Not difficult at all    phq 9 is negative   Fall Risk: Fall Risk  08/24/2020 02/22/2020 09/01/2019 02/28/2019 07/30/2018   Falls in the past year? 0 0 0 0 0  Number falls in past yr: 0 0 0 0 -  Injury with Fall? 0 0 0 0 -     Functional Status Survey: Is the patient deaf or have difficulty hearing?: No Does the patient have difficulty seeing, even when wearing glasses/contacts?: No Does the patient have difficulty concentrating, remembering, or making decisions?: No Does the patient have difficulty walking or climbing stairs?: No Does the patient have difficulty dressing or bathing?: No Does the patient have difficulty doing errands alone such as visiting a doctor's office or shopping?: No    Assessment & Plan  1. Dyslipidemia  - COMPLETE METABOLIC PANEL WITH GFR -  Lipid panel - omega-3 acid ethyl esters (LOVAZA) 1 g capsule; Take 1 capsule (1 g total) by mouth 2 (two) times daily.  Dispense: 360 capsule; Refill: 1  2. Pre-diabetes   3. GAD (generalized anxiety disorder)  - venlafaxine XR (EFFEXOR-XR) 150 MG 24 hr capsule; Take 1 capsule (150 mg total) by mouth daily with breakfast.  Dispense: 90 capsule; Refill: 1  4. Mild intermittent reactive airway disease without complication  Doing well   5. Depression, major, in remission ([place])  - venlafaxine XR (EFFEXOR-XR) 150 MG 24 hr capsule; Take 1 capsule (150 mg total) by mouth daily with breakfast.  Dispense: 90 capsule; Refill: 1  6. Migraine without aura, intractable, without status migrainosus  - atenolol (TENORMIN) 25 MG tablet; Take 1 tablet (25 mg total) by mouth daily.  Dispense: 90 tablet; Refill: 1 - ondansetron (ZOFRAN) 4 MG tablet; Take 1 tablet (4 mg total) by mouth every 8 (eight) hours as needed for nausea or vomiting.  Dispense: 20 tablet; Refill: 0 - Rimegepant Sulfate (NURTEC) 75 MG TBDP; Take 1 tablet by mouth daily as needed.  Dispense: 9 tablet; Refill: 1  7. Vitamin D deficiency   8. Hypercalcemia  - PTH, intact and calcium  9. Hypertriglyceridemia  - omega-3 acid ethyl esters (LOVAZA) 1 g capsule; Take 1 capsule  (1 g total) by mouth 2 (two) times daily.  Dispense: 360 capsule; Refill: 1  10. Depression, major, in remission ([place])  - venlafaxine XR (EFFEXOR-XR) 150 MG 24 hr capsule; Take 1 capsule (150 mg total) by mouth daily with breakfast.  Dispense: 90 capsule; Refill: 1  11. Colon cancer screening  - Ambulatory referral to Gastroenterology

## 2020-08-24 ENCOUNTER — Encounter: Payer: Self-pay | Admitting: Family Medicine

## 2020-08-24 ENCOUNTER — Ambulatory Visit (INDEPENDENT_AMBULATORY_CARE_PROVIDER_SITE_OTHER): Payer: BC Managed Care – PPO | Admitting: Family Medicine

## 2020-08-24 ENCOUNTER — Other Ambulatory Visit: Payer: Self-pay

## 2020-08-24 VITALS — BP 128/70 | HR 92 | Temp 98.0°F | Resp 16 | Ht 63.0 in | Wt 153.9 lb

## 2020-08-24 DIAGNOSIS — E785 Hyperlipidemia, unspecified: Secondary | ICD-10-CM

## 2020-08-24 DIAGNOSIS — J4520 Mild intermittent asthma, uncomplicated: Secondary | ICD-10-CM | POA: Diagnosis not present

## 2020-08-24 DIAGNOSIS — R7303 Prediabetes: Secondary | ICD-10-CM | POA: Diagnosis not present

## 2020-08-24 DIAGNOSIS — F411 Generalized anxiety disorder: Secondary | ICD-10-CM | POA: Diagnosis not present

## 2020-08-24 DIAGNOSIS — E559 Vitamin D deficiency, unspecified: Secondary | ICD-10-CM

## 2020-08-24 DIAGNOSIS — E781 Pure hyperglyceridemia: Secondary | ICD-10-CM

## 2020-08-24 DIAGNOSIS — Z1211 Encounter for screening for malignant neoplasm of colon: Secondary | ICD-10-CM

## 2020-08-24 DIAGNOSIS — F325 Major depressive disorder, single episode, in full remission: Secondary | ICD-10-CM

## 2020-08-24 DIAGNOSIS — G43019 Migraine without aura, intractable, without status migrainosus: Secondary | ICD-10-CM

## 2020-08-24 MED ORDER — ONDANSETRON HCL 4 MG PO TABS: 4.0000 mg | ORAL_TABLET | Freq: Three times a day (TID) | ORAL | 0 refills | Status: DC | PRN

## 2020-08-24 MED ORDER — ATENOLOL 25 MG PO TABS: 25.0000 mg | ORAL_TABLET | Freq: Every day | ORAL | 1 refills | Status: DC

## 2020-08-24 MED ORDER — VENLAFAXINE HCL ER 150 MG PO CP24: 150.0000 mg | ORAL_CAPSULE | Freq: Every day | ORAL | 1 refills | Status: DC

## 2020-08-24 MED ORDER — OMEGA-3-ACID ETHYL ESTERS 1 G PO CAPS: 1.0000 | ORAL_CAPSULE | Freq: Two times a day (BID) | ORAL | 1 refills | Status: DC

## 2020-08-24 MED ORDER — NURTEC 75 MG PO TBDP: 1.0000 | ORAL_TABLET | Freq: Every day | ORAL | 1 refills | Status: DC | PRN

## 2020-08-27 LAB — LIPID PANEL
Cholesterol: 217 mg/dL — ABNORMAL HIGH (ref <200)
HDL: 39 mg/dL — ABNORMAL LOW (ref >=50)
LDL Cholesterol (Calc): 145 mg/dL (calc) — ABNORMAL HIGH
Non-HDL Cholesterol (Calc): 178 mg/dL (calc) — ABNORMAL HIGH (ref <130)
Total CHOL/HDL Ratio: 5.6 (calc) — ABNORMAL HIGH (ref <5.0)
Triglycerides: 191 mg/dL — ABNORMAL HIGH (ref <150)

## 2020-08-27 LAB — COMPLETE METABOLIC PANEL WITH GFR
AG Ratio: 2 (calc) (ref 1.0–2.5)
ALT: 32 U/L — ABNORMAL HIGH (ref 6–29)
AST: 24 U/L (ref 10–35)
Albumin: 4.5 g/dL (ref 3.6–5.1)
Alkaline phosphatase (APISO): 72 U/L (ref 31–125)
BUN: 14 mg/dL (ref 7–25)
CO2: 28 mmol/L (ref 20–32)
Calcium: 9.8 mg/dL (ref 8.6–10.2)
Chloride: 103 mmol/L (ref 98–110)
Creat: 0.62 mg/dL (ref 0.50–1.10)
GFR, Est African American: 123 mL/min/{1.73_m2} (ref >=60)
GFR, Est Non African American: 106 mL/min/{1.73_m2} (ref >=60)
Globulin: 2.3 g/dL (calc) (ref 1.9–3.7)
Glucose, Bld: 88 mg/dL (ref 65–99)
Potassium: 4.4 mmol/L (ref 3.5–5.3)
Sodium: 138 mmol/L (ref 135–146)
Total Bilirubin: 0.4 mg/dL (ref 0.2–1.2)
Total Protein: 6.8 g/dL (ref 6.1–8.1)

## 2020-08-27 LAB — PTH, INTACT AND CALCIUM
Calcium: 9.8 mg/dL (ref 8.6–10.2)
PTH: 35 pg/mL (ref 14–64)

## 2020-09-20 ENCOUNTER — Telehealth: Payer: Self-pay

## 2020-09-20 ENCOUNTER — Other Ambulatory Visit: Payer: Self-pay | Admitting: Family Medicine

## 2020-09-20 MED ORDER — UBRELVY 100 MG PO TABS: 1.0000 | ORAL_TABLET | Freq: Every day | ORAL | 0 refills | Status: DC | PRN

## 2020-09-20 NOTE — Telephone Encounter (Signed)
Nurtec denied, try to write for ubrelvy instead per insurance

## 2020-10-23 ENCOUNTER — Telehealth: Payer: Self-pay

## 2020-10-23 NOTE — Telephone Encounter (Signed)
Copied from [place] [id number]. Topic: General - Other >> Oct 23, 2020  2:52 PM Tessa Lerner A wrote: Reason for CRM: Efrian with [employer] has made contact regarding a prior authorization for patient  Please contact to further advise when possible

## 2020-11-02 DIAGNOSIS — Z20822 Contact with and (suspected) exposure to covid-19: Secondary | ICD-10-CM | POA: Diagnosis not present

## 2020-11-07 ENCOUNTER — Encounter: Payer: BC Managed Care – PPO | Admitting: Dermatology

## 2020-11-19 DIAGNOSIS — N6011 Diffuse cystic mastopathy of right breast: Secondary | ICD-10-CM | POA: Diagnosis not present

## 2020-11-19 DIAGNOSIS — N6001 Solitary cyst of right breast: Secondary | ICD-10-CM | POA: Diagnosis not present

## 2020-11-29 ENCOUNTER — Other Ambulatory Visit: Payer: BC Managed Care – PPO

## 2020-12-04 DIAGNOSIS — Z20822 Contact with and (suspected) exposure to covid-19: Secondary | ICD-10-CM | POA: Diagnosis not present

## 2020-12-26 ENCOUNTER — Other Ambulatory Visit: Payer: Self-pay | Admitting: Family Medicine

## 2020-12-26 DIAGNOSIS — G43019 Migraine without aura, intractable, without status migrainosus: Secondary | ICD-10-CM

## 2021-01-07 ENCOUNTER — Encounter: Payer: Self-pay | Admitting: Family Medicine

## 2021-01-07 DIAGNOSIS — Z20822 Contact with and (suspected) exposure to covid-19: Secondary | ICD-10-CM | POA: Diagnosis not present

## 2021-01-07 DIAGNOSIS — U071 COVID-19: Secondary | ICD-10-CM | POA: Diagnosis not present

## 2021-02-18 ENCOUNTER — Other Ambulatory Visit: Payer: Self-pay | Admitting: Family Medicine

## 2021-02-18 DIAGNOSIS — F411 Generalized anxiety disorder: Secondary | ICD-10-CM

## 2021-02-18 DIAGNOSIS — F325 Major depressive disorder, single episode, in full remission: Secondary | ICD-10-CM

## 2021-02-21 IMAGING — MG MM DIGITAL DIAGNOSTIC UNILAT*R* W/ TOMO W/ CAD
4 series · 4 of 12 positions shown · non-contrast
Comparison: Previous exam(s).

CLINICAL DATA: Patient recalled from screening for right breast
mass.

EXAM:
DIGITAL DIAGNOSTIC RIGHT MAMMOGRAM WITH CAD AND TOMO
ULTRASOUND RIGHT BREAST

[R MLO synth-2D]
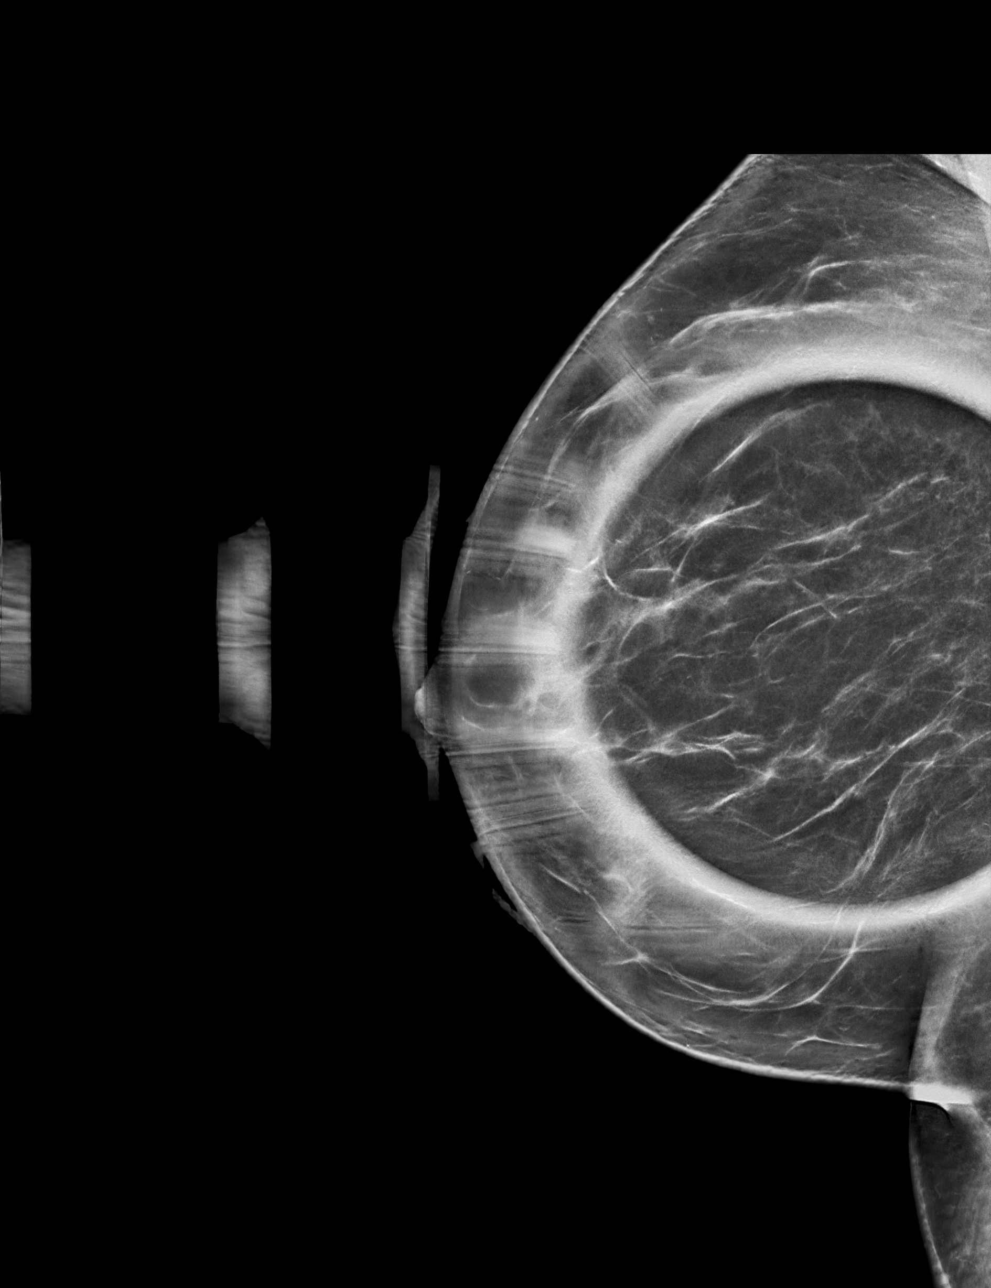

[R CC synth-2D]
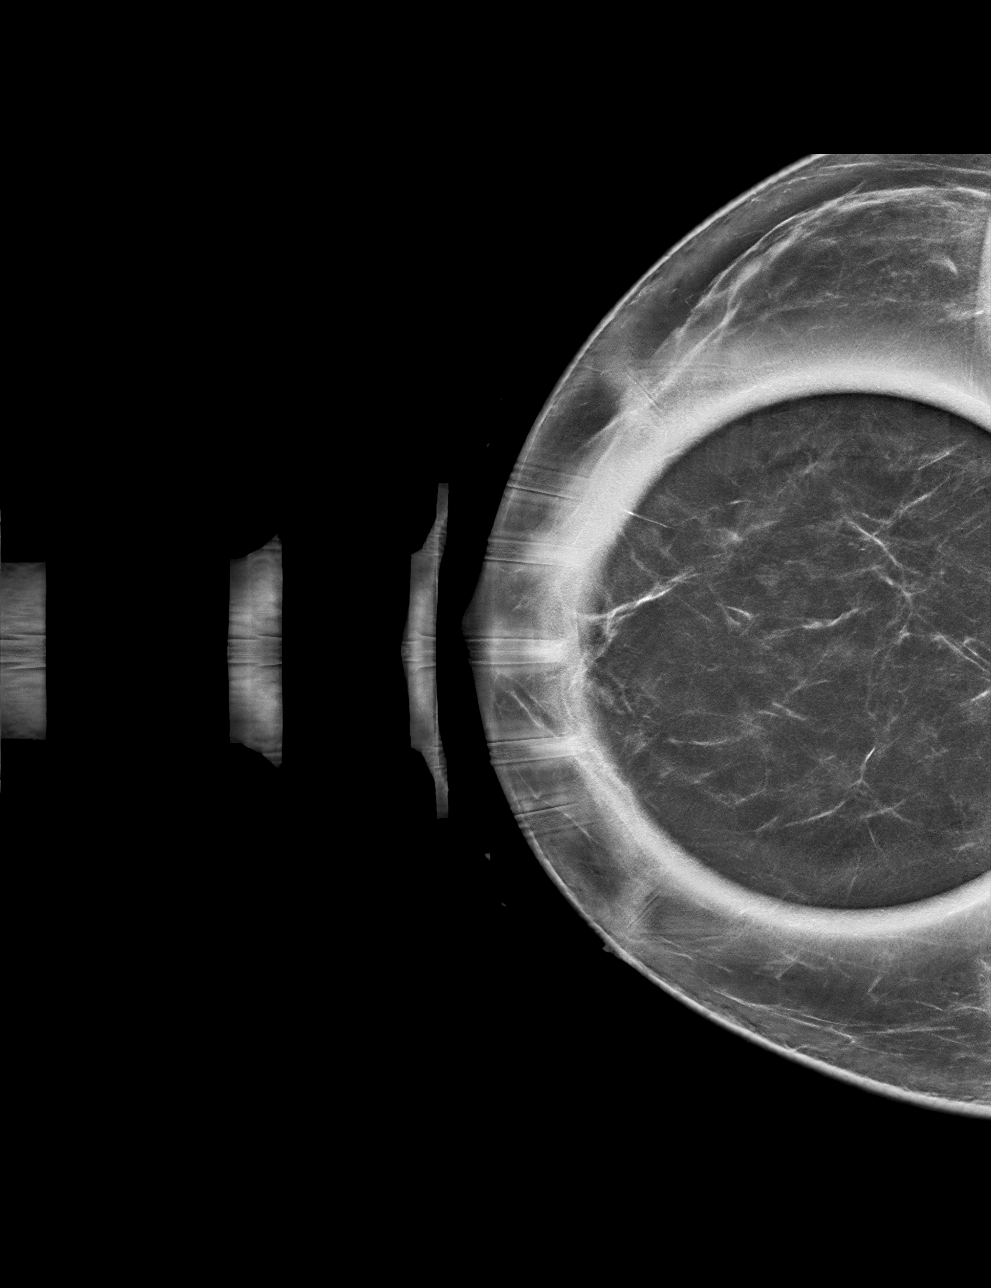

[R CC tomo · tomo slice 37/73.0]
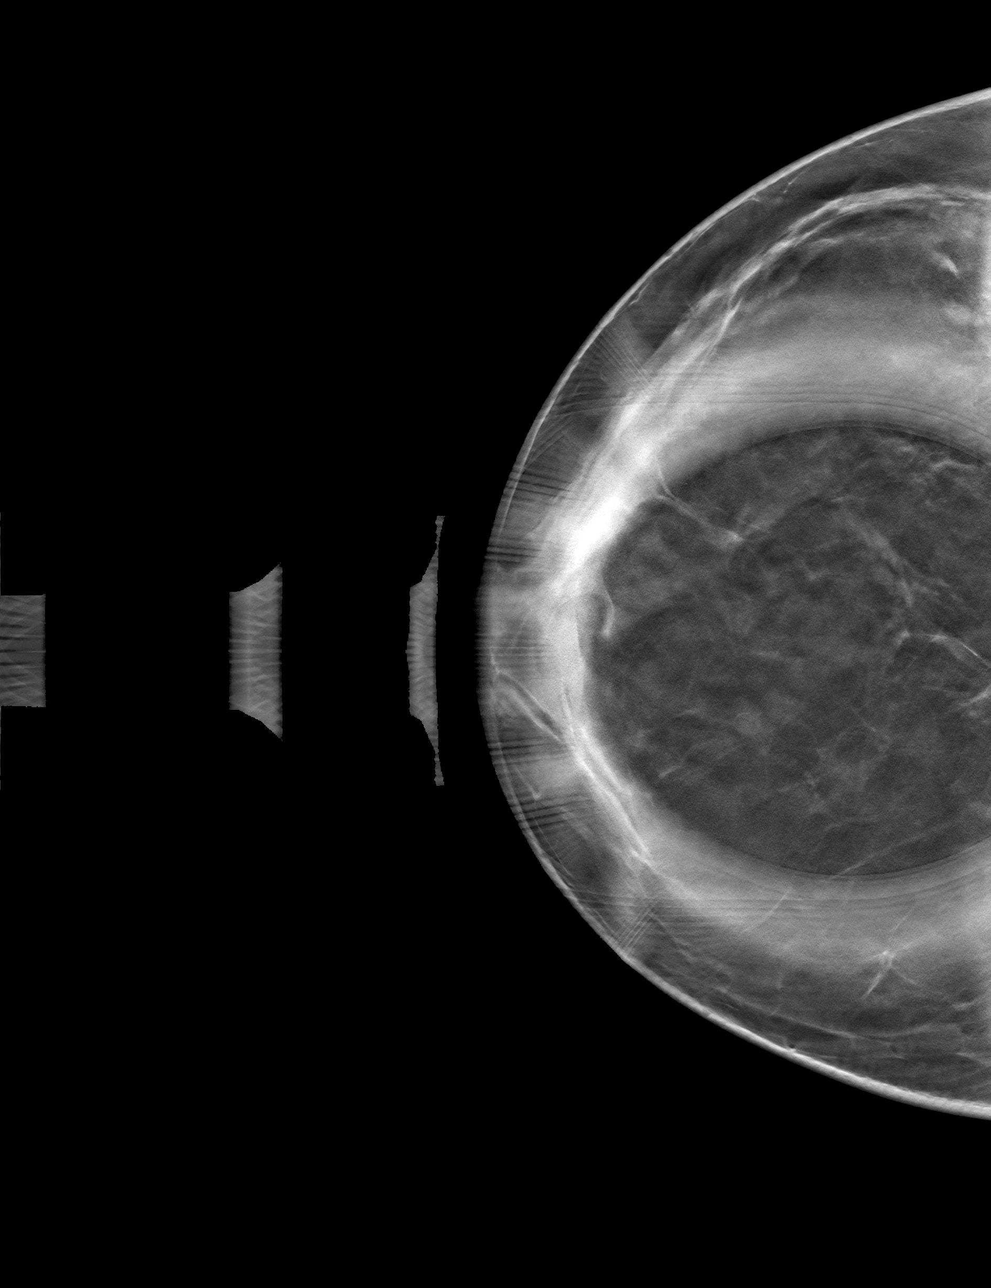

[R MLO tomo · tomo slice 41/80.0]
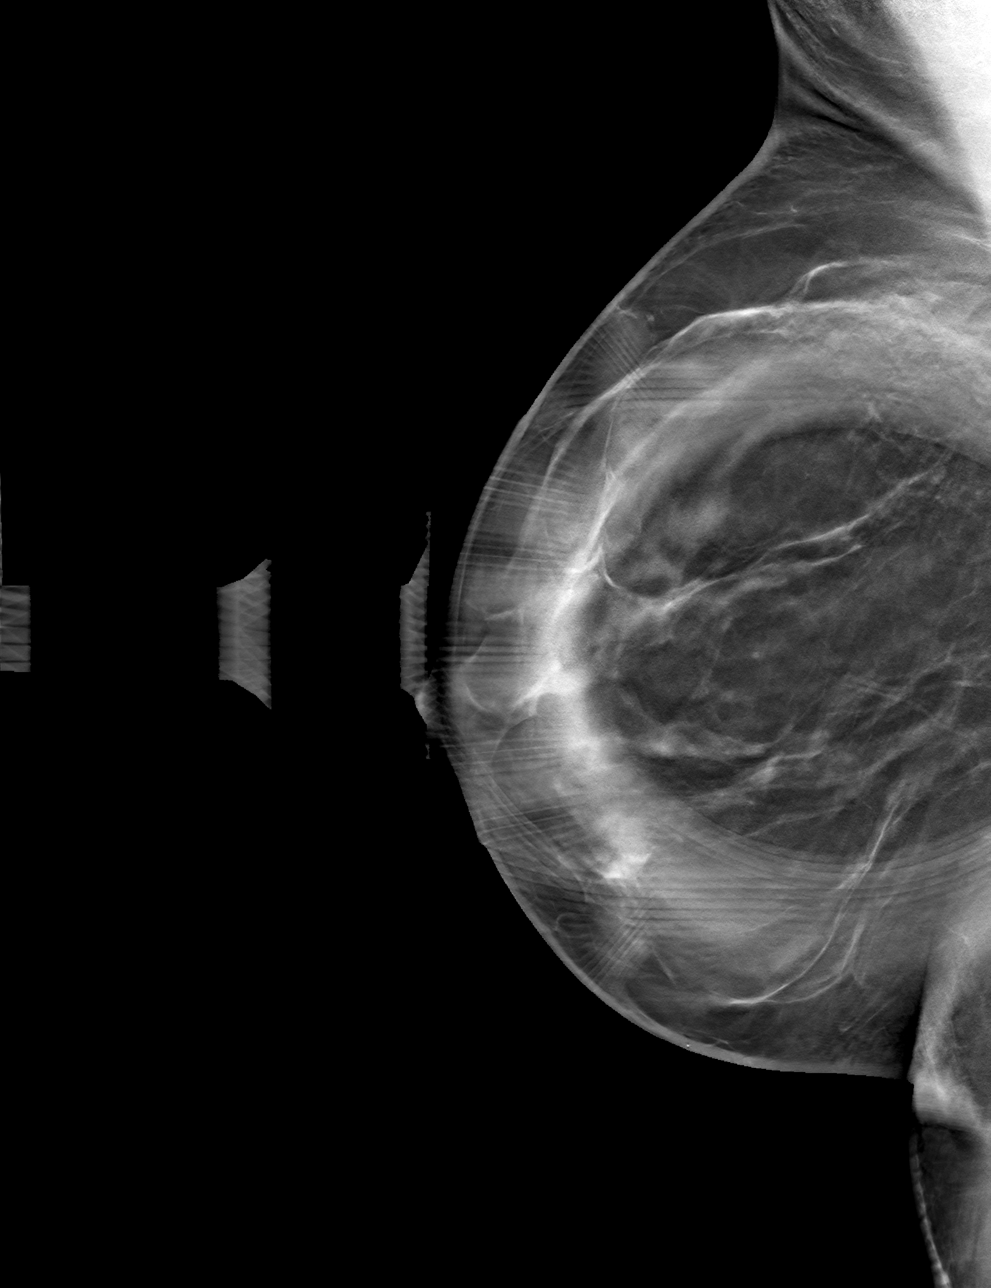

[4 of 12 positions shown; findings below may reference images not displayed]

ACR Breast Density Category c: The breast tissue is heterogeneously
dense, which may obscure small masses.
FINDINGS: There is a persistent small low-density circumscribed mass within
the inferior right breast middle depth, further evaluated with
additional imaging.

Mammographic images were processed with CAD.

Targeted ultrasound is performed, showing a 4 x 3 x 2 mm probable
complicated cyst right breast 6 o'clock position 4 cm from nipple
and a 4 x 4 x 2 mm probable complicated cyst right breast 7 o'clock
position 4 cm from the nipple.
IMPRESSION: Two adjacent probable complicated cysts inferior right breast.

RECOMMENDATION:
Right breast ultrasound in 6 months to reassess the probable
complicated cysts right breast 6 and 7 o'clock position.

I have discussed the findings and recommendations with the patient.
If applicable, a reminder letter will be sent to the patient
regarding the next appointment.

BI-RADS CATEGORY  3: Probably benign.

## 2021-02-21 IMAGING — US US BREAST*R* LIMITED INC AXILLA
1 series · 11 of 11 positions shown · non-contrast
Comparison: Previous exam(s).

CLINICAL DATA: Patient recalled from screening for right breast
mass.

EXAM:
DIGITAL DIAGNOSTIC RIGHT MAMMOGRAM WITH CAD AND TOMO
ULTRASOUND RIGHT BREAST

[Series 1: us breast*right* limited inc axilla · 0.07mm/px · 11 of 11 slices shown]
[im 1/11]
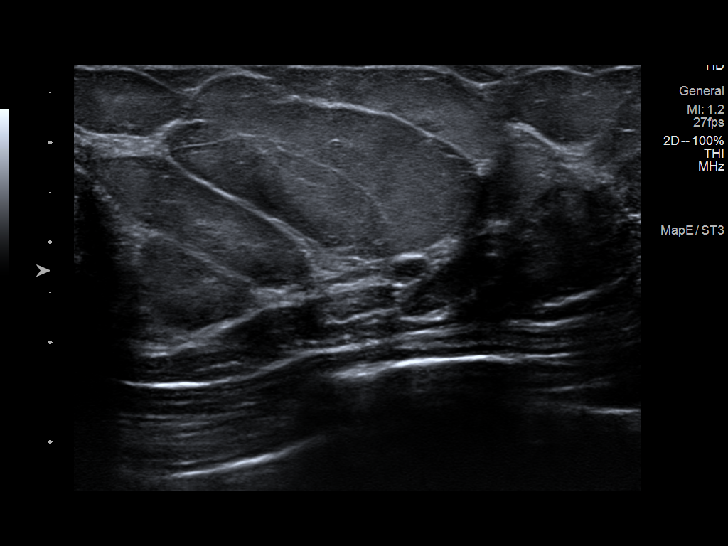
[im 2/11]
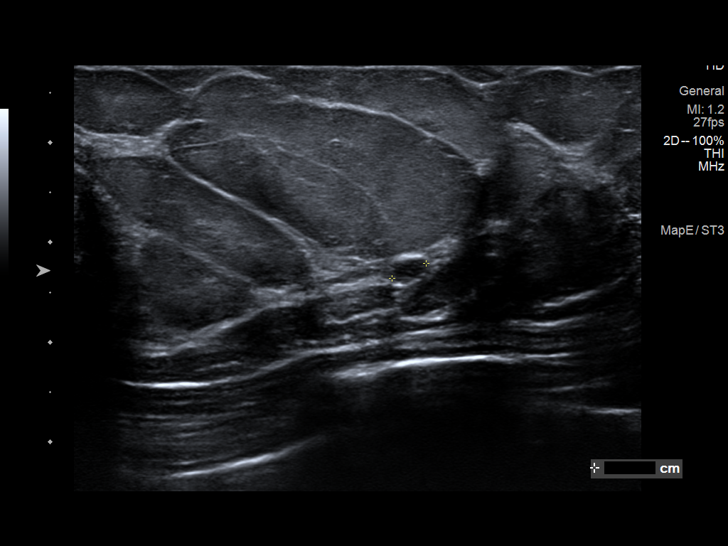
[im 3/11]
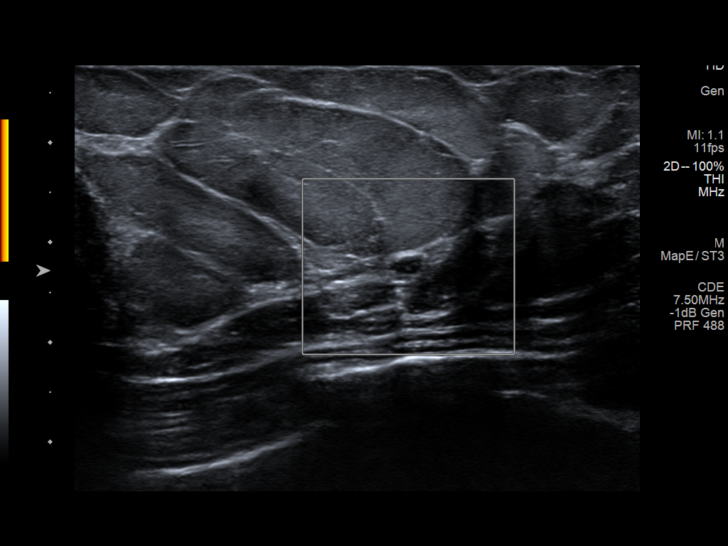
[im 4/11]
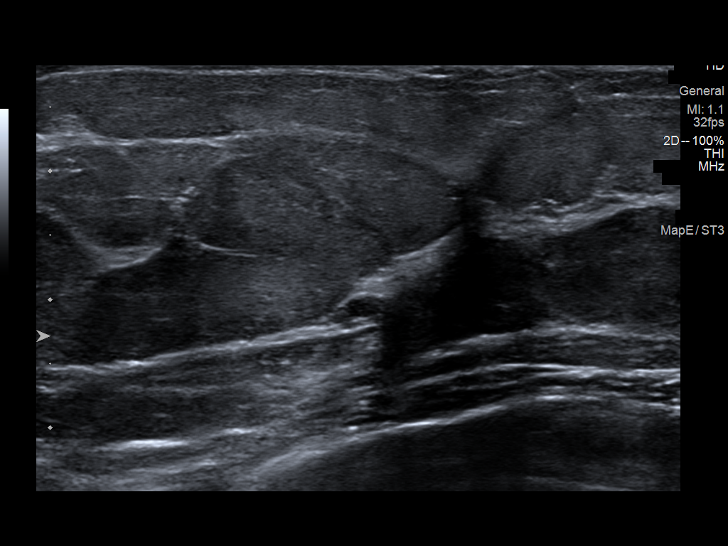
[im 5/11]
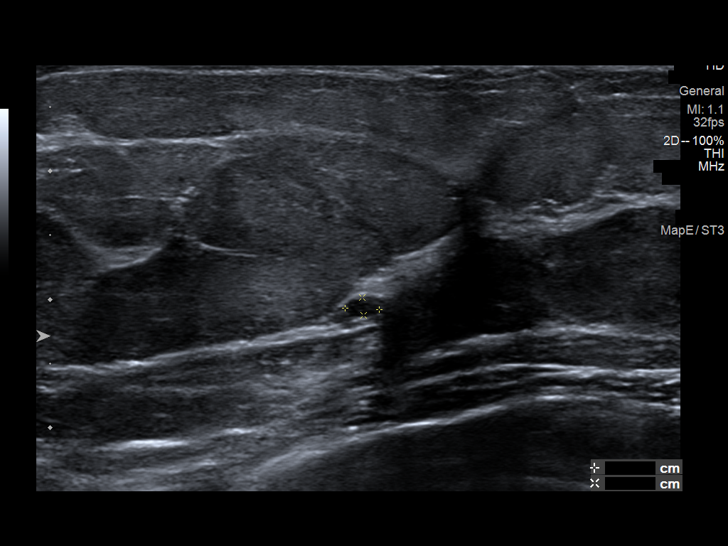
[im 6/11]
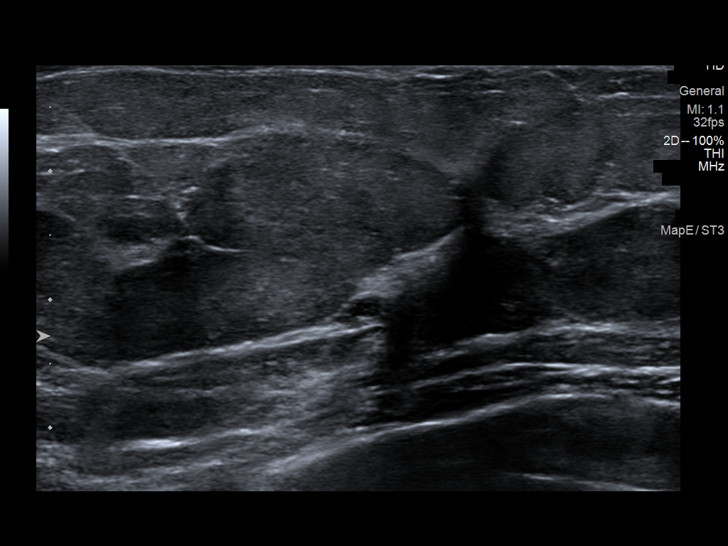
[im 7/11]
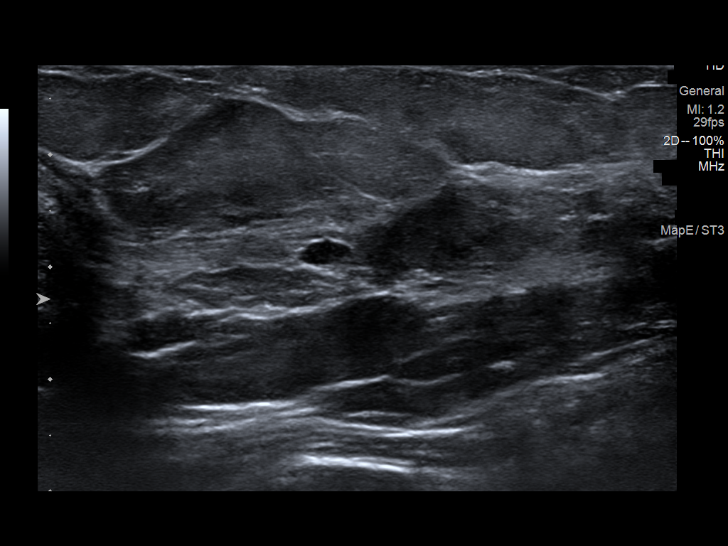
[im 8/11]
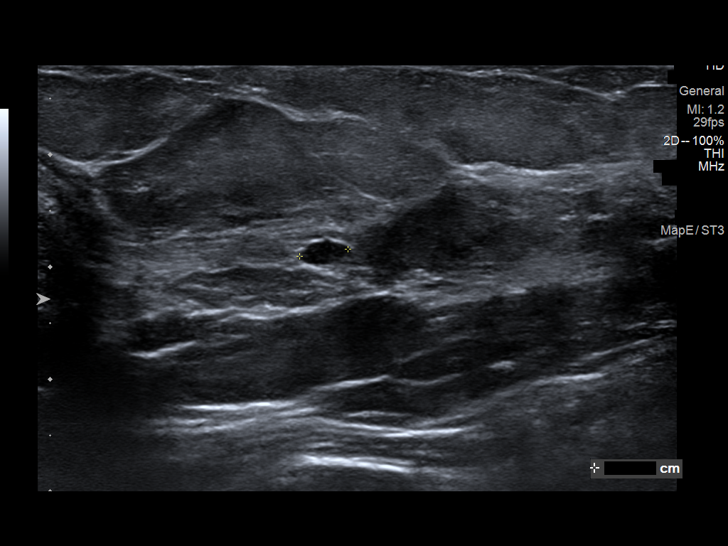
[im 9/11]
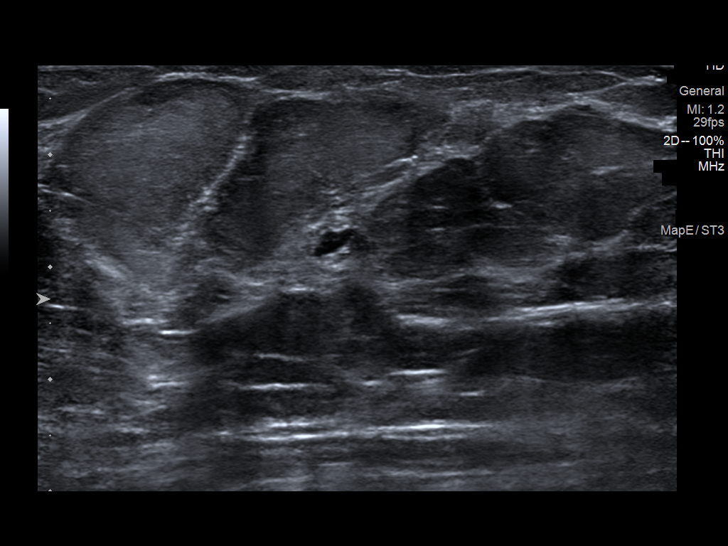
[im 10/11]
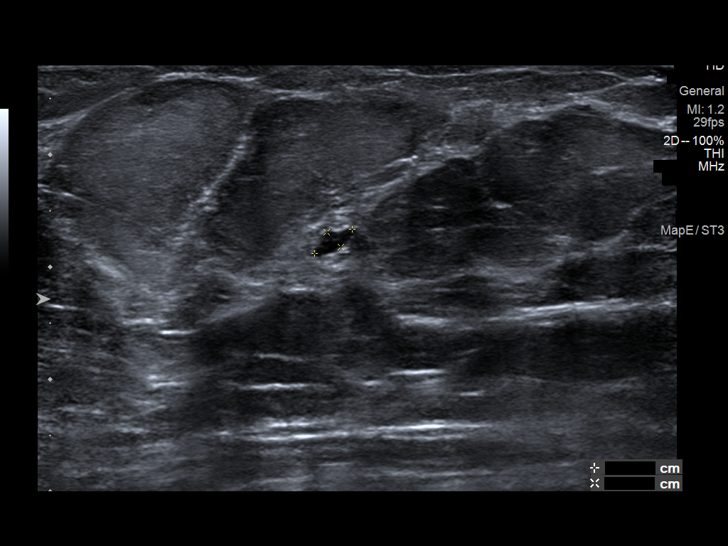
[im 11/11]
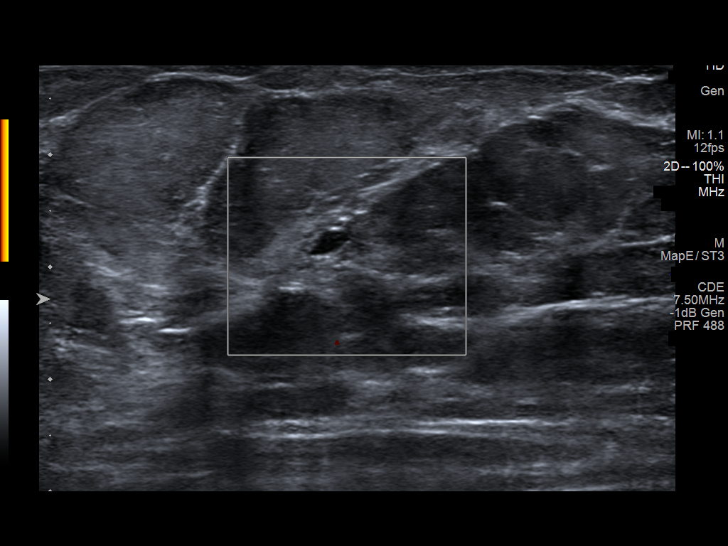

[11 of 11 positions shown; findings below may reference images not displayed]

ACR Breast Density Category c: The breast tissue is heterogeneously
dense, which may obscure small masses.
FINDINGS: There is a persistent small low-density circumscribed mass within
the inferior right breast middle depth, further evaluated with
additional imaging.

Mammographic images were processed with CAD.

Targeted ultrasound is performed, showing a 4 x 3 x 2 mm probable
complicated cyst right breast 6 o'clock position 4 cm from nipple
and a 4 x 4 x 2 mm probable complicated cyst right breast 7 o'clock
position 4 cm from the nipple.
IMPRESSION: Two adjacent probable complicated cysts inferior right breast.

RECOMMENDATION:
Right breast ultrasound in 6 months to reassess the probable
complicated cysts right breast 6 and 7 o'clock position.

I have discussed the findings and recommendations with the patient.
If applicable, a reminder letter will be sent to the patient
regarding the next appointment.

BI-RADS CATEGORY  3: Probably benign.

## 2021-02-21 NOTE — Progress Notes (Signed)
Name: Mikayla Fritz   MRN: [id number]    DOB: [birth year]   Date:02/22/2021       Progress Note  Subjective  Chief Complaint  Follow Up  HPI  Migraine headache: she is doing very well since started on Atenolol Summer 2020. Migraines used to be prior to her cycles and it was  associated with nausea , phonophobia and photophobia, usually frontal sometimes nuchal and throbbing. She does not like using Imitrex due to side effects, makes her loopy, she has Ubrelvy at home but has not used it yet    Major Depression and GAD: she tried going down from Effexor 150 to 75 , but when she tried to go down on the dose she felt irritable. She only has difficulty falling asleep at times, she has been taking Melatonin and is working well for her. Unchanged    Dyslipidemia: she is taking Lovaza two capsules twice daily, she states her mother had 4 heart attacks and first one around age 42, her ASCVD score is low but I recommend starting low dose statin today. We will try low dose Crestor . Discussed possible side effects   The 10-year ASCVD risk score Mikayla Fritz) is: 1.9%   Values used to calculate the score:     Age: 50 years     Sex: Female     Is Non-Hispanic African American: No     Diabetic: No     Tobacco smoker: No     Systolic Blood Pressure: 123456 mmHg     Is BP treated: No     HDL Cholesterol: 39 mg/dL     Total Cholesterol: 217 mg/dL .    Prediabetes: based on increase in abdominal girth, elevated bp on her last visit , also high triglycerides and low HDL. She is not consistent with diet . Discussed importance of increase in physical activity    RAD: she states since childhood she has noticed problems breathing. She states she has never been evaluated for asthma. She has to use inhaler when laughing hard, also with physical activity.   Patient Active Problem List   Diagnosis Date Noted   Osteopenia 04/27/2019   Thrombocytosis 07/30/2018   Migraine without aura, intractable,  without status migrainosus 10/08/2017   Mild depression ([place]) 10/08/2017   Low HDL (under 40) 09/18/2016   GERD without esophagitis 06/19/2016   Hypertriglyceridemia 06/19/2016   Recurrent cystitis 06/19/2016   External hemorrhoids 08/05/2007   Perennial allergic rhinitis 08/05/2007   History of gastritis 08/05/2007   GAD (generalized anxiety disorder) 08/05/2007    Past Surgical History:  Procedure Laterality Date   TONSILLECTOMY  2002    Family History  Problem Relation Age of Onset   Heart disease Mother    Cancer Mother        Kidney   Heart attack Mother        3   Arthritis Mother    Thyroid disease Mother    Hypertension Father    Diabetes Father    Anxiety disorder Brother    Cancer Maternal Grandmother        Breast   Breast cancer Maternal Grandmother     Social History   Tobacco Use   Smoking status: Never   Smokeless tobacco: Never  Substance Use Topics   Alcohol use: No     Current Outpatient Medications:    albuterol (VENTOLIN HFA) 108 (90 Base) MCG/ACT inhaler, TAKE 2 PUFFS BY MOUTH EVERY 6 HOURS AS  NEEDED FOR WHEEZE OR SHORTNESS OF BREATH, Disp: 8.5 each, Rfl: 11   atenolol (TENORMIN) 25 MG tablet, Take 1 tablet (25 mg total) by mouth daily., Disp: 90 tablet, Rfl: 1   omega-3 acid ethyl esters (LOVAZA) 1 g capsule, Take 1 capsule (1 g total) by mouth 2 (two) times daily., Disp: 360 capsule, Rfl: 1   ondansetron (ZOFRAN) 4 MG tablet, TAKE 1 TABLET BY MOUTH EVERY 8 HOURS AS NEEDED FOR NAUSEA AND VOMITING, Disp: 20 tablet, Rfl: 0   venlafaxine XR (EFFEXOR-XR) 150 MG 24 hr capsule, TAKE 1 CAPSULE BY MOUTH DAILY WITH BREAKFAST., Disp: 90 capsule, Rfl: 0   Ubrogepant (UBRELVY) 100 MG TABS, Take 1 tablet by mouth daily as needed. (Patient not taking: Reported on 02/22/2021), Disp: 16 tablet, Rfl: 0  Allergies  Allergen Reactions   Clindamycin/Lincomycin Itching, Nausea Only and Other (See Comments)    Stomach aches and pain   Codeine     REACTION:  makes feel loopy    I personally reviewed active problem list, medication list, allergies, family history, social history, health maintenance with the patient/caregiver today.   ROS  Constitutional: Negative for fever or weight change.  Respiratory: positive for cough and shortness of breath.   Cardiovascular: Negative for chest pain or palpitations.  Gastrointestinal: Negative for abdominal pain, no bowel changes.  Musculoskeletal: Negative for gait problem or joint swelling.  Skin: Negative for rash.  Neurological: Negative for dizziness or headache.  No other specific complaints in a complete review of systems (except as listed in HPI above).   Objective  Vitals:   02/22/21 0752  BP: 120/68  Pulse: 95  Resp: 16  Temp: 98.2 F (36.8 C)  SpO2: 97%  Weight: 155 lb (70.3 kg)  Height: '5\' 3"'$  (1.6 m)    Body mass index is 27.46 kg/m.  Physical Exam  Constitutional: Patient appears well-developed and well-nourished. Obese  No distress.  HEENT: head atraumatic, normocephalic, pupils equal and reactive to light, neck supple Cardiovascular: Normal rate, regular rhythm and normal heart sounds.  No murmur heard. No BLE edema. Pulmonary/Chest: Effort normal and breath sounds normal. No respiratory distress. Abdominal: Soft.  There is no tenderness. Psychiatric: Patient has a normal mood and affect. behavior is normal. Judgment and thought content normal.    PHQ2/9: Depression screen [facility] 2/9 02/22/2021 08/24/2020 02/22/2020 09/01/2019 02/28/2019  Decreased Interest 0 0 0 0 0  Down, Depressed, Hopeless 0 0 0 0 0  PHQ - 2 Score 0 0 0 0 0  Altered sleeping 0 0 '1 1 1  '$ Tired, decreased energy 0 0 '2 1 1  '$ Change in appetite 0 0 0 0 0  Feeling bad or failure about yourself  0 0 0 0 0  Trouble concentrating 0 0 0 0 0  Moving slowly or fidgety/restless 0 0 0 0 0  Suicidal thoughts 0 0 0 0 0  PHQ-9 Score 0 0 '3 2 2  '$ Difficult doing work/chores - - Not difficult at all Not difficult at all  Not difficult at all  Some recent data might be hidden    phq 9 is negative   Fall Risk: Fall Risk  02/22/2021 08/24/2020 02/22/2020 09/01/2019 02/28/2019  Falls in the past year? 0 0 0 0 0  Number falls in past yr: 0 0 0 0 0  Injury with Fall? 0 0 0 0 0      Functional Status Survey: Is the patient deaf or have difficulty hearing?: No Does the patient  have difficulty seeing, even when wearing glasses/contacts?: No Does the patient have difficulty concentrating, remembering, or making decisions?: No Does the patient have difficulty walking or climbing stairs?: No Does the patient have difficulty dressing or bathing?: No Does the patient have difficulty doing errands alone such as visiting a doctor's office or shopping?: No    Assessment & Plan 1. Dyslipidemia  - rosuvastatin (CRESTOR) 10 MG tablet; Take 1 tablet (10 mg total) by mouth daily.  Dispense: 90 tablet; Refill: 1 - omega-3 acid ethyl esters (LOVAZA) 1 g capsule; Take 2 capsules (2 g total) by mouth 2 (two) times daily.  Dispense: 360 capsule; Refill: 1  2. Pre-diabetes   3. GAD (generalized anxiety disorder)  - venlafaxine XR (EFFEXOR-XR) 150 MG 24 hr capsule; Take 1 capsule (150 mg total) by mouth daily with breakfast.  Dispense: 90 capsule; Refill: 1  4. Migraine without aura, intractable, without status migrainosus  - atenolol (TENORMIN) 25 MG tablet; Take 1 tablet (25 mg total) by mouth daily.  Dispense: 90 tablet; Refill: 1  5. Vitamin D deficiency   6. Mild intermittent reactive airway disease without complication   7. Depression, major, in remission ([place])  - venlafaxine XR (EFFEXOR-XR) 150 MG 24 hr capsule; Take 1 capsule (150 mg total) by mouth daily with breakfast.  Dispense: 90 capsule; Refill: 1  8. Encounter for screening mammogram for malignant neoplasm of breast   9. Family history of heart disease   10. Need for shingles vaccine  - Varicella-zoster vaccine IM  11. Need for vaccination  for pneumococcus  - Pneumococcal conjugate vaccine 20-valent (Prevnar 20)  12. Colon cancer screening  - Cologuard  13. Hypertriglyceridemia  - omega-3 acid ethyl esters (LOVAZA) 1 g capsule; Take 2 capsules (2 g total) by mouth 2 (two) times daily.  Dispense: 360 capsule; Refill: 1  14. Depression, major, in remission ([employer])  - venlafaxine XR (EFFEXOR-XR) 150 MG 24 hr capsule; Take 1 capsule (150 mg total) by mouth daily with breakfast.  Dispense: 90 capsule; Refill: 1

## 2021-02-22 ENCOUNTER — Ambulatory Visit (INDEPENDENT_AMBULATORY_CARE_PROVIDER_SITE_OTHER): Payer: BC Managed Care – PPO | Admitting: Family Medicine

## 2021-02-22 ENCOUNTER — Other Ambulatory Visit: Payer: Self-pay

## 2021-02-22 ENCOUNTER — Encounter: Payer: Self-pay | Admitting: Family Medicine

## 2021-02-22 VITALS — BP 120/68 | HR 95 | Temp 98.2°F | Resp 16 | Ht 63.0 in | Wt 155.0 lb

## 2021-02-22 DIAGNOSIS — E559 Vitamin D deficiency, unspecified: Secondary | ICD-10-CM

## 2021-02-22 DIAGNOSIS — E785 Hyperlipidemia, unspecified: Secondary | ICD-10-CM

## 2021-02-22 DIAGNOSIS — J4520 Mild intermittent asthma, uncomplicated: Secondary | ICD-10-CM

## 2021-02-22 DIAGNOSIS — R7303 Prediabetes: Secondary | ICD-10-CM

## 2021-02-22 DIAGNOSIS — E781 Pure hyperglyceridemia: Secondary | ICD-10-CM

## 2021-02-22 DIAGNOSIS — F411 Generalized anxiety disorder: Secondary | ICD-10-CM | POA: Diagnosis not present

## 2021-02-22 DIAGNOSIS — Z1211 Encounter for screening for malignant neoplasm of colon: Secondary | ICD-10-CM

## 2021-02-22 DIAGNOSIS — G43019 Migraine without aura, intractable, without status migrainosus: Secondary | ICD-10-CM | POA: Diagnosis not present

## 2021-02-22 DIAGNOSIS — Z23 Encounter for immunization: Secondary | ICD-10-CM

## 2021-02-22 DIAGNOSIS — F325 Major depressive disorder, single episode, in full remission: Secondary | ICD-10-CM

## 2021-02-22 DIAGNOSIS — Z1231 Encounter for screening mammogram for malignant neoplasm of breast: Secondary | ICD-10-CM

## 2021-02-22 DIAGNOSIS — Z8249 Family history of ischemic heart disease and other diseases of the circulatory system: Secondary | ICD-10-CM

## 2021-02-22 MED ORDER — ROSUVASTATIN CALCIUM 10 MG PO TABS: 10.0000 mg | ORAL_TABLET | Freq: Every day | ORAL | 1 refills | Status: DC

## 2021-02-22 MED ORDER — OMEGA-3-ACID ETHYL ESTERS 1 G PO CAPS: 2.0000 g | ORAL_CAPSULE | Freq: Two times a day (BID) | ORAL | 1 refills | Status: DC

## 2021-02-22 MED ORDER — ATENOLOL 25 MG PO TABS: 25.0000 mg | ORAL_TABLET | Freq: Every day | ORAL | 1 refills | Status: DC

## 2021-02-22 MED ORDER — VENLAFAXINE HCL ER 150 MG PO CP24: 150.0000 mg | ORAL_CAPSULE | Freq: Every day | ORAL | 1 refills | Status: DC

## 2021-03-04 ENCOUNTER — Encounter: Payer: Self-pay | Admitting: Family Medicine

## 2021-03-11 DIAGNOSIS — Z1211 Encounter for screening for malignant neoplasm of colon: Secondary | ICD-10-CM | POA: Diagnosis not present

## 2021-03-18 LAB — COLOGUARD: Cologuard: NEGATIVE

## 2021-05-01 DIAGNOSIS — N6001 Solitary cyst of right breast: Secondary | ICD-10-CM | POA: Diagnosis not present

## 2021-05-01 DIAGNOSIS — R922 Inconclusive mammogram: Secondary | ICD-10-CM | POA: Diagnosis not present

## 2021-05-01 LAB — HM MAMMOGRAPHY

## 2021-07-24 NOTE — Progress Notes (Signed)
Name: Mikayla Fritz   MRN: [id number]    DOB: 1970-11-06   Date:07/25/2021       Progress Note  Subjective  Chief Complaint  Annual Exam  HPI  Patient presents for annual CPE.  Diet: since Christmas she has been more mindful about her diet, avoid sugar  Exercise: discussed 150 minutes of regular physical activity    [place] Office Visit from 09/01/2019 in [facility]  AUDIT-C Score 0      Depression: Phq 9 is  negative Depression screen [facility] 2/9 07/25/2021 02/22/2021 08/24/2020 02/22/2020 09/01/2019  Decreased Interest 0 0 0 0 0  Down, Depressed, Hopeless 0 0 0 0 0  PHQ - 2 Score 0 0 0 0 0  Altered sleeping 0 0 0 1 1  Tired, decreased energy 0 0 0 2 1  Change in appetite 0 0 0 0 0  Feeling bad or failure about yourself  0 0 0 0 0  Trouble concentrating 0 0 0 0 0  Moving slowly or fidgety/restless 0 0 0 0 0  Suicidal thoughts 0 0 0 0 0  PHQ-9 Score 0 0 0 3 2  Difficult doing work/chores - - - Not difficult at all Not difficult at all  Some recent data might be hidden   Hypertension: BP Readings from Last 3 Encounters:  07/25/21 114/70  02/22/21 120/68  08/24/20 128/70   Obesity: Wt Readings from Last 3 Encounters:  07/25/21 159 lb (72.1 kg)  02/22/21 155 lb (70.3 kg)  08/24/20 153 lb 14.4 oz (69.8 kg)   BMI Readings from Last 3 Encounters:  07/25/21 28.17 kg/m  02/22/21 27.46 kg/m  08/24/20 27.26 kg/m     Vaccines:   Shingrix: she will get a booster at local pharmacy  Pneumonia: educated and discussed with patient. Flu: up to date   Hep C Screening: 02/22/20 STD testing and prevention (HIV/chl/gon/syphilis): 06/19/16 Intimate partner violence: negative Sexual History : lack of libido, but no pain or discomfort  Menstrual History/LMP/Abnormal Bleeding: regular cycles, mild cramping - husband had vasectomy  Incontinence Symptoms: no problems   Breast cancer:  - Last Mammogram: 05/2021 at [facility] - BRCA gene screening:  N/A  Osteoporosis: Discussed high calcium and vitamin D supplementation, weight bearing exercises  Cervical cancer screening: up to date , goes to gyn   Skin cancer: Discussed monitoring for atypical lesions  Colorectal cancer: 06/15/02   Lung cancer: Low Dose CT Chest recommended if Age 11-80 years, 20 pack-year currently smoking OR have quit w/in 15years. Patient does not qualify.   ECG: 06/19/16  Advanced Care Planning: A voluntary discussion about advance care planning including the explanation and discussion of advance directives.  Discussed health care proxy and Living will, and the patient was able to identify a health care proxy as husband.    Lipids: Lab Results  Component Value Date   CHOL 217 (H) 08/24/2020   CHOL 225 (H) 02/22/2020   CHOL 236 (H) 02/21/2019   Lab Results  Component Value Date   HDL 39 (L) 08/24/2020   HDL 38 (L) 02/22/2020   HDL 42 (L) 02/21/2019   Lab Results  Component Value Date   LDLCALC 145 (H) 08/24/2020   LDLCALC 138 (H) 02/22/2020   LDLCALC 156 (H) 02/21/2019   Lab Results  Component Value Date   TRIG 191 (H) 08/24/2020   TRIG 341 (H) 02/22/2020   TRIG 222 (H) 02/21/2019   Lab Results  Component Value Date   CHOLHDL  5.6 (H) 08/24/2020   CHOLHDL 5.9 (H) 02/22/2020   CHOLHDL 5.6 (H) 02/21/2019   No results found for: LDLDIRECT  Glucose: Glucose, Bld  Date Value Ref Range Status  08/24/2020 88 65 - 99 mg/dL Final    Comment:    .            Fasting reference interval .   02/22/2020 109 (H) 65 - 99 mg/dL Final    Comment:    .            Fasting reference interval . For someone without known diabetes, a glucose value between 100 and 125 mg/dL is consistent with prediabetes and should be confirmed with a follow-up test. .   02/21/2019 103 (H) 65 - 99 mg/dL Final    Comment:    .            Fasting reference interval . For someone without known diabetes, a glucose value between 100 and 125 mg/dL is consistent  with prediabetes and should be confirmed with a follow-up test. .     Patient Active Problem List   Diagnosis Date Noted   Osteopenia 04/27/2019   Thrombocytosis 07/30/2018   Migraine without aura, intractable, without status migrainosus 10/08/2017   Mild depression 10/08/2017   Low HDL (under 40) 09/18/2016   GERD without esophagitis 06/19/2016   Hypertriglyceridemia 06/19/2016   Recurrent cystitis 06/19/2016   External hemorrhoids 08/05/2007   Perennial allergic rhinitis 08/05/2007   History of gastritis 08/05/2007   GAD (generalized anxiety disorder) 08/05/2007    Past Surgical History:  Procedure Laterality Date   TONSILLECTOMY  2002    Family History  Problem Relation Age of Onset   Heart disease Mother    Cancer Mother        Kidney   Heart attack Mother        3   Arthritis Mother    Thyroid disease Mother    Hypertension Father    Diabetes Father    Anxiety disorder Brother    Cancer Maternal Grandmother        Breast   Breast cancer Maternal Grandmother     Social History   Socioeconomic History   Marital status: Married    Spouse name: Berneta Sages    Number of children: 1   Years of education: Not on file   Highest education level: Some college, no degree  Occupational History   Not on file  Tobacco Use   Smoking status: Never   Smokeless tobacco: Never  Vaping Use   Vaping Use: Never used  Substance and Sexual Activity   Alcohol use: No   Drug use: No   Sexual activity: Yes    Partners: Male    Comment: Husband had vasectomy  Other Topics Concern   Not on file  Social History Narrative   Works at [employer], married   Social Determinants of Radio broadcast assistant Strain: Low Risk    Difficulty of Paying Living Expenses: Not hard at all  Food Insecurity: No Food Insecurity   Worried About Charity fundraiser in the Last Year: Never true   Arboriculturist in the Last Year: Never true  Transportation Needs: No Transportation Needs    Lack of Transportation (Medical): No   Lack of Transportation (Non-Medical): No  Physical Activity: Inactive   Days of Exercise per Week: 0 days   Minutes of Exercise per Session: 0 min  Stress: No Stress Concern Present  Feeling of Stress : Not at all  Social Connections: Moderately Isolated   Frequency of Communication with Friends and Family: More than three times a week   Frequency of Social Gatherings with Friends and Family: More than three times a week   Attends Religious Services: Never   Marine scientist or Organizations: No   Attends Music therapist: Never   Marital Status: Married  Human resources officer Violence: Not At Risk   Fear of Current or Ex-Partner: No   Emotionally Abused: No   Physically Abused: No   Sexually Abused: No     Current Outpatient Medications:    albuterol (VENTOLIN HFA) 108 (90 Base) MCG/ACT inhaler, TAKE 2 PUFFS BY MOUTH EVERY 6 HOURS AS NEEDED FOR WHEEZE OR SHORTNESS OF BREATH, Disp: 8.5 each, Rfl: 11   atenolol (TENORMIN) 25 MG tablet, Take 1 tablet (25 mg total) by mouth daily., Disp: 90 tablet, Rfl: 1   omega-3 acid ethyl esters (LOVAZA) 1 g capsule, Take 2 capsules (2 g total) by mouth 2 (two) times daily., Disp: 360 capsule, Rfl: 1   ondansetron (ZOFRAN) 4 MG tablet, TAKE 1 TABLET BY MOUTH EVERY 8 HOURS AS NEEDED FOR NAUSEA AND VOMITING, Disp: 20 tablet, Rfl: 0   rosuvastatin (CRESTOR) 10 MG tablet, Take 1 tablet (10 mg total) by mouth daily., Disp: 90 tablet, Rfl: 1   venlafaxine XR (EFFEXOR-XR) 150 MG 24 hr capsule, Take 1 capsule (150 mg total) by mouth daily with breakfast., Disp: 90 capsule, Rfl: 1   Ubrogepant (UBRELVY) 100 MG TABS, Take 1 tablet by mouth daily as needed. (Patient not taking: Reported on 07/25/2021), Disp: 16 tablet, Rfl: 0  Allergies  Allergen Reactions   Clindamycin/Lincomycin Itching, Nausea Only and Other (See Comments)    Stomach aches and pain   Codeine     REACTION: makes feel loopy      ROS  Ten systems reviewed and is negative except as mentioned in HPI   Objective  Vitals:   07/25/21 0817  BP: 114/70  Pulse: 91  Resp: 16  Temp: 98.2 F (36.8 C)  SpO2: 98%  Weight: 159 lb (72.1 kg)  Height: 5' 3" (1.6 m)    Body mass index is 28.17 kg/m.  Physical Exam  Constitutional: Patient appears well-developed and well-nourished. No distress.  HENT: Head: Normocephalic and atraumatic. Ears: B TMs ok, no erythema or effusion; Nose: Not done . Mouth/Throat:not done  Eyes: Conjunctivae and EOM are normal. Pupils are equal, round, and reactive to light. No scleral icterus.  Neck: Normal range of motion. Neck supple. No JVD present. No thyromegaly present.  Cardiovascular: Normal rate, regular rhythm and normal heart sounds.  No murmur heard. No BLE edema. Pulmonary/Chest: Effort normal and breath sounds normal. No respiratory distress. Abdominal: Soft. Bowel sounds are normal, no distension. There is no tenderness. no masses Breast: no lumps or masses, no nipple discharge or rashes FEMALE GENITALIA:  Not done  RECTAL: not done  Musculoskeletal: Normal range of motion, no joint effusions. No gross deformities Neurological: he is alert and oriented to person, place, and time. No cranial nerve deficit. Coordination, balance, strength, speech and gait are normal.  Skin: Skin is warm and dry. No rash noted. No erythema.  Psychiatric: Patient has a normal mood and affect. behavior is normal. Judgment and thought content normal.   Fall Risk: Fall Risk  07/25/2021 02/22/2021 08/24/2020 02/22/2020 09/01/2019  Falls in the past year? 0 0 0 0 0  Number falls  in past yr: 0 0 0 0 0  Injury with Fall? 0 0 0 0 0  Risk for fall due to : No Fall Risks - - - -  Follow up Falls prevention discussed - - - -     Functional Status Survey: Is the patient deaf or have difficulty hearing?: No Does the patient have difficulty seeing, even when wearing glasses/contacts?: No Does the  patient have difficulty concentrating, remembering, or making decisions?: No Does the patient have difficulty walking or climbing stairs?: No Does the patient have difficulty dressing or bathing?: No Does the patient have difficulty doing errands alone such as visiting a doctor's office or shopping?: No   Assessment & Plan  1. Well adult exam  - Lipid panel - COMPLETE METABOLIC PANEL WITH GFR - CBC with Differential/Platelet - Hemoglobin A1c  2. Pre-diabetes  - Hemoglobin A1c  3. Dyslipidemia  - Lipid panel  4. Long-term use of high-risk medication  - COMPLETE METABOLIC PANEL WITH GFR - CBC with Differential/Platelet  5. Need for shingles vaccine  - Zoster Vaccine [facility]juvant[facility]) injection; Inject 0.5 mLs into the muscle once for 1 dose.  Dispense: 0.5 mL; Refill: 0   -USPSTF grade A and B recommendations reviewed with patient; age-appropriate recommendations, preventive care, screening tests, etc discussed and encouraged; healthy living encouraged; see AVS for patient education given to patient -Discussed importance of 150 minutes of physical activity weekly, eat two servings of fish weekly, eat one serving of tree nuts ( cashews, pistachios, pecans, almonds.Marland Kitchen) every other day, eat 6 servings of fruit/vegetables daily and drink plenty of water and avoid sweet beverages.

## 2021-07-25 ENCOUNTER — Other Ambulatory Visit: Payer: Self-pay

## 2021-07-25 ENCOUNTER — Encounter: Payer: Self-pay | Admitting: Family Medicine

## 2021-07-25 ENCOUNTER — Ambulatory Visit (INDEPENDENT_AMBULATORY_CARE_PROVIDER_SITE_OTHER): Payer: BC Managed Care – PPO | Admitting: Family Medicine

## 2021-07-25 VITALS — BP 114/70 | HR 91 | Temp 98.2°F | Resp 16 | Ht 63.0 in | Wt 159.0 lb

## 2021-07-25 DIAGNOSIS — Z79899 Other long term (current) drug therapy: Secondary | ICD-10-CM | POA: Diagnosis not present

## 2021-07-25 DIAGNOSIS — R7303 Prediabetes: Secondary | ICD-10-CM | POA: Diagnosis not present

## 2021-07-25 DIAGNOSIS — Z0000 Encounter for general adult medical examination without abnormal findings: Secondary | ICD-10-CM | POA: Diagnosis not present

## 2021-07-25 DIAGNOSIS — E785 Hyperlipidemia, unspecified: Secondary | ICD-10-CM | POA: Diagnosis not present

## 2021-07-25 DIAGNOSIS — Z124 Encounter for screening for malignant neoplasm of cervix: Secondary | ICD-10-CM

## 2021-07-25 DIAGNOSIS — Z23 Encounter for immunization: Secondary | ICD-10-CM

## 2021-07-25 MED ORDER — SHINGRIX 50 MCG/0.5ML IM SUSR: 0.5000 mL | Freq: Once | INTRAMUSCULAR | 0 refills | Status: AC

## 2021-07-25 NOTE — Patient Instructions (Signed)
OOFOS sandals  Preventive Care 23-51 Years Old, Female Preventive care refers to lifestyle choices and visits with your health care provider that can promote health and wellness. Preventive care visits are also called wellness exams. What can I expect for my preventive care visit? Counseling Your health care provider may ask you questions about your: Medical history, including: Past medical problems. Family medical history. Pregnancy history. Current health, including: Menstrual cycle. Method of birth control. Emotional well-being. Home life and relationship well-being. Sexual activity and sexual health. Lifestyle, including: Alcohol, nicotine or tobacco, and drug use. Access to firearms. Diet, exercise, and sleep habits. Work and work Statistician. Sunscreen use. Safety issues such as seatbelt and bike helmet use. Physical exam Your health care provider will check your: Height and weight. These may be used to calculate your BMI (body mass index). BMI is a measurement that tells if you are at a healthy weight. Waist circumference. This measures the distance around your waistline. This measurement also tells if you are at a healthy weight and may help predict your risk of certain diseases, such as type 2 diabetes and high blood pressure. Heart rate and blood pressure. Body temperature. Skin for abnormal spots. What immunizations do I need? Vaccines are usually given at various ages, according to a schedule. Your health care provider will recommend vaccines for you based on your age, medical history, and lifestyle or other factors, such as travel or where you work. What tests do I need? Screening Your health care provider may recommend screening tests for certain conditions. This may include: Lipid and cholesterol levels. Diabetes screening. This is done by checking your blood sugar (glucose) after you have not eaten for a while (fasting). Pelvic exam and Pap test. Hepatitis B  test. Hepatitis C test. HIV (human immunodeficiency virus) test. STI (sexually transmitted infection) testing, if you are at risk. Lung cancer screening. Colorectal cancer screening. Mammogram. Talk with your health care provider about when you should start having regular mammograms. This may depend on whether you have a family history of breast cancer. BRCA-related cancer screening. This may be done if you have a family history of breast, ovarian, tubal, or peritoneal cancers. Bone density scan. This is done to screen for osteoporosis. Talk with your health care provider about your test results, treatment options, and if necessary, the need for more tests. Follow these instructions at home: Eating and drinking  Eat a diet that includes fresh fruits and vegetables, whole grains, lean protein, and low-fat dairy products. Take vitamin and mineral supplements as recommended by your health care provider. Do not drink alcohol if: Your health care provider tells you not to drink. You are pregnant, may be pregnant, or are planning to become pregnant. If you drink alcohol: Limit how much you have to 0-1 drink a day. Know how much alcohol is in your drink. In the [place] one drink equals one 12 oz bottle of beer (355 mL), one 5 oz glass of wine (148 mL), or one 1 oz glass of hard liquor (44 mL). Lifestyle Brush your teeth every morning and night with fluoride toothpaste. Floss one time each day. Exercise for at least 30 minutes 5 or more days each week. Do not use any products that contain nicotine or tobacco. These products include cigarettes, chewing tobacco, and vaping devices, such as e-cigarettes. If you need help quitting, ask your health care provider. Do not use drugs. If you are sexually active, practice safe sex. Use a condom or other form of  protection to prevent STIs. If you do not wish to become pregnant, use a form of birth control. If you plan to become pregnant, see your health care  provider for a prepregnancy visit. Take aspirin only as told by your health care provider. Make sure that you understand how much to take and what form to take. Work with your health care provider to find out whether it is safe and beneficial for you to take aspirin daily. Find healthy ways to manage stress, such as: Meditation, yoga, or listening to music. Journaling. Talking to a trusted person. Spending time with friends and family. Minimize exposure to UV radiation to reduce your risk of skin cancer. Safety Always wear your seat belt while driving or riding in a vehicle. Do not drive: If you have been drinking alcohol. Do not ride with someone who has been drinking. When you are tired or distracted. While texting. If you have been using any mind-altering substances or drugs. Wear a helmet and other protective equipment during sports activities. If you have firearms in your house, make sure you follow all gun safety procedures. Seek help if you have been physically or sexually abused. What's next? Visit your health care provider once a year for an annual wellness visit. Ask your health care provider how often you should have your eyes and teeth checked. Stay up to date on all vaccines. This information is not intended to replace advice given to you by your health care provider. Make sure you discuss any questions you have with your health care provider. Document Revised: 12/26/2020 Document Reviewed: 12/26/2020 Elsevier Patient Education  [place].

## 2021-07-26 LAB — CBC WITH DIFFERENTIAL/PLATELET
Absolute Monocytes: 683 cells/uL (ref 200–950)
Basophils Absolute: 50 cells/uL (ref 0–200)
Basophils Relative: 0.5 %
Eosinophils Absolute: 119 cells/uL (ref 15–500)
Eosinophils Relative: 1.2 %
HCT: 39.6 % (ref 35.0–45.0)
Hemoglobin: 13 g/dL (ref 11.7–15.5)
Lymphs Abs: 3515 cells/uL (ref 850–3900)
MCH: 29.8 pg (ref 27.0–33.0)
MCHC: 32.8 g/dL (ref 32.0–36.0)
MCV: 90.8 fL (ref 80.0–100.0)
MPV: 9.5 fL (ref 7.5–12.5)
Monocytes Relative: 6.9 %
Neutro Abs: 5534 cells/uL (ref 1500–7800)
Neutrophils Relative %: 55.9 %
Platelets: 378 10*3/uL (ref 140–400)
RBC: 4.36 10*6/uL (ref 3.80–5.10)
RDW: 12.5 % (ref 11.0–15.0)
Total Lymphocyte: 35.5 %
WBC: 9.9 10*3/uL (ref 3.8–10.8)

## 2021-07-26 LAB — HEMOGLOBIN A1C
Hgb A1c MFr Bld: 5.5 % of total Hgb (ref <5.7)
Mean Plasma Glucose: 111 mg/dL
eAG (mmol/L): 6.2 mmol/L

## 2021-07-26 LAB — COMPLETE METABOLIC PANEL WITH GFR
AG Ratio: 2 (calc) (ref 1.0–2.5)
ALT: 28 U/L (ref 6–29)
AST: 22 U/L (ref 10–35)
Albumin: 4.5 g/dL (ref 3.6–5.1)
Alkaline phosphatase (APISO): 58 U/L (ref 37–153)
BUN: 15 mg/dL (ref 7–25)
CO2: 29 mmol/L (ref 20–32)
Calcium: 9.7 mg/dL (ref 8.6–10.4)
Chloride: 105 mmol/L (ref 98–110)
Creat: 0.66 mg/dL (ref 0.50–1.03)
Globulin: 2.3 g/dL (calc) (ref 1.9–3.7)
Glucose, Bld: 102 mg/dL — ABNORMAL HIGH (ref 65–99)
Potassium: 5 mmol/L (ref 3.5–5.3)
Sodium: 139 mmol/L (ref 135–146)
Total Bilirubin: 0.4 mg/dL (ref 0.2–1.2)
Total Protein: 6.8 g/dL (ref 6.1–8.1)
eGFR: 107 mL/min/{1.73_m2} (ref >=60)

## 2021-07-26 LAB — LIPID PANEL
Cholesterol: 122 mg/dL (ref <200)
HDL: 42 mg/dL — ABNORMAL LOW (ref >=50)
LDL Cholesterol (Calc): 59 mg/dL (calc)
Non-HDL Cholesterol (Calc): 80 mg/dL (calc) (ref <130)
Total CHOL/HDL Ratio: 2.9 (calc) (ref <5.0)
Triglycerides: 127 mg/dL (ref <150)

## 2021-08-01 ENCOUNTER — Encounter: Payer: Self-pay | Admitting: Family Medicine

## 2021-08-13 DIAGNOSIS — Z6828 Body mass index (BMI) 28.0-28.9, adult: Secondary | ICD-10-CM | POA: Diagnosis not present

## 2021-08-13 DIAGNOSIS — Z01419 Encounter for gynecological examination (general) (routine) without abnormal findings: Secondary | ICD-10-CM | POA: Diagnosis not present

## 2021-08-13 LAB — HM PAP SMEAR: HM Pap smear: NORMAL

## 2021-08-17 ENCOUNTER — Other Ambulatory Visit: Payer: Self-pay | Admitting: Family Medicine

## 2021-08-17 DIAGNOSIS — F411 Generalized anxiety disorder: Secondary | ICD-10-CM

## 2021-08-17 DIAGNOSIS — F325 Major depressive disorder, single episode, in full remission: Secondary | ICD-10-CM

## 2021-08-17 DIAGNOSIS — E785 Hyperlipidemia, unspecified: Secondary | ICD-10-CM

## 2021-08-17 NOTE — Telephone Encounter (Signed)
Requested Prescriptions  Pending Prescriptions Disp Refills   rosuvastatin (CRESTOR) 10 MG tablet [Pharmacy Med Name: ROSUVASTATIN CALCIUM 10 MG TAB] 90 tablet 1    Sig: TAKE 1 TABLET BY MOUTH EVERY DAY     Cardiovascular:  Antilipid - Statins 2 Failed - 08/17/2021 12:58 AM      Failed - Lipid Panel in normal range within the last 12 months    Cholesterol  Date Value Ref Range Status  07/25/2021 122 <200 mg/dL Final   LDL Cholesterol (Calc)  Date Value Ref Range Status  07/25/2021 59 mg/dL (calc) Final    Comment:    Reference range: <100 . Desirable range <100 mg/dL for primary prevention;   <70 mg/dL for patients with CHD or diabetic patients  with > or = 2 CHD risk factors. Marland Kitchen LDL-C is now calculated using the Martin-Hopkins  calculation, which is a validated novel method providing  better accuracy than the Friedewald equation in the  estimation of LDL-C.  Cresenciano Genre et al. Annamaria Helling. 4098;119(14): 2061-2068  (http://education.[facility].com/faq/FAQ164)    HDL  Date Value Ref Range Status  07/25/2021 42 (L) > OR = 50 mg/dL Final   Triglycerides  Date Value Ref Range Status  07/25/2021 127 <150 mg/dL Final         Passed - Cr in normal range and within 360 days    Creat  Date Value Ref Range Status  07/25/2021 0.66 0.50 - 1.03 mg/dL Final         Passed - Patient is not pregnant      Passed - Valid encounter within last 12 months    Recent Outpatient Visits          3 weeks ago Well adult exam   [facility] Steele Sizer, MD   5 months ago Dyslipidemia   [facility] Steele Sizer, MD   11 months ago Dyslipidemia   [facility] Steele Sizer, MD   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     Psychiatry: Antidepressants - SNRI - desvenlafaxine & venlafaxine Failed - 08/17/2021 12:58 AM      Failed - Lipid Panel in normal range within the last 12 months    Cholesterol  Date Value Ref Range Status  07/25/2021 122 <200 mg/dL Final   LDL Cholesterol (Calc)  Date Value Ref Range Status  07/25/2021 59 mg/dL (calc) Final    Comment:    Reference range: <100 . Desirable range <100 mg/dL for primary prevention;   <70 mg/dL for patients with CHD or diabetic patients  with > or = 2 CHD risk factors. Marland Kitchen LDL-C is now calculated using the Martin-Hopkins  calculation, which is a validated novel method providing  better accuracy than the Friedewald equation in the  estimation of LDL-C.  Cresenciano Genre et al. Annamaria Helling. 7829;562(13):  2061-2068  (http://education.[facility].com/faq/FAQ164)    HDL  Date Value Ref Range Status  07/25/2021 42 (L) > OR = 50 mg/dL Final   Triglycerides  Date Value Ref Range Status  07/25/2021 127 <150 mg/dL Final         Passed - Cr in normal range and within 360 days    Creat  Date Value Ref Range Status  07/25/2021 0.66 0.50 - 1.03 mg/dL Final         Passed - Completed PHQ-2 or PHQ-9 in the last 360 days      Passed - Last BP in normal range    BP Readings from Last 1 Encounters:  07/25/21 114/70         Passed - Valid encounter within last 6 months    Recent Outpatient Visits          3 weeks ago Well adult exam   [facility] Steele Sizer, MD   5 months ago Dyslipidemia   [facility] Steele Sizer, MD   11 months ago Dyslipidemia   [facility] Steele Sizer, MD   1  year ago Depression, major, in remission [facility])   [facility] Steele Sizer, MD   1 year ago Yeast vaginitis   [facility] Steele Sizer, MD      Future Appointments            In 1 week Steele Sizer, MD [facility], [place]   In 11 months Steele Sizer, MD [facility], [facility]

## 2021-08-26 NOTE — Progress Notes (Unsigned)
Patient lft for cruise on 08-25-2021

## 2021-08-28 ENCOUNTER — Ambulatory Visit: Payer: BC Managed Care – PPO | Admitting: Family Medicine

## 2021-09-10 ENCOUNTER — Other Ambulatory Visit: Payer: Self-pay | Admitting: Family Medicine

## 2021-09-10 DIAGNOSIS — G43019 Migraine without aura, intractable, without status migrainosus: Secondary | ICD-10-CM

## 2021-09-20 ENCOUNTER — Other Ambulatory Visit: Payer: Self-pay | Admitting: Family Medicine

## 2021-09-20 DIAGNOSIS — E785 Hyperlipidemia, unspecified: Secondary | ICD-10-CM

## 2021-09-20 DIAGNOSIS — E781 Pure hyperglyceridemia: Secondary | ICD-10-CM

## 2021-09-20 NOTE — Telephone Encounter (Signed)
Requested Prescriptions  ?Pending Prescriptions Disp Refills  ?? omega-3 acid ethyl esters (LOVAZA) 1 g capsule [Pharmacy Med Name: OMEGA-3 ETHYL ESTERS 1 GM CAP] 360 capsule 1  ?  Sig: TAKE 1 CAPSULE BY MOUTH 2 TIMES DAILY.  ?  ? Endocrinology:  Nutritional Agents - omega-3 acid ethyl esters Failed - 09/20/2021  8:51 AM  ?  ?  Failed - Lipid Panel in normal range within the last 12 months  ?  Cholesterol  ?Date Value Ref Range Status  ?07/25/2021 122 <200 mg/dL Final  ? ?LDL Cholesterol (Calc)  ?Date Value Ref Range Status  ?07/25/2021 59 mg/dL (calc) Final  ?  Comment:  ?  Reference range: <100 ?Marland Kitchen ?Desirable range <100 mg/dL for primary prevention;   ?<70 mg/dL for patients with CHD or diabetic patients  ?with > or = 2 CHD risk factors. ?. ?LDL-C is now calculated using the Martin-Hopkins  ?calculation, which is a validated novel method providing  ?better accuracy than the Friedewald equation in the  ?estimation of LDL-C.  ?Cresenciano Genre et al. Annamaria Helling. 3419;622(29): 2061-2068  ?(http://education.[facility].com/faq/FAQ164) ?  ? ?HDL  ?Date Value Ref Range Status  ?07/25/2021 42 (L) > OR = 50 mg/dL Final  ? ?Triglycerides  ?Date Value Ref Range Status  ?07/25/2021 127 <150 mg/dL Final  ? ?  ?  ?  Passed - Valid encounter within last 12 months  ?  Recent Outpatient Visits   ?      ? 1 month ago Well adult exam  ? [facility], Drue Stager, MD  ? 7 months ago Dyslipidemia  ? [facility] Steele Sizer, MD  ? 1 year ago Dyslipidemia  ? [facility] [place], Drue Stager, MD  ? 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 [facility] Steele Sizer, MD  ? 2 years ago Yeast vaginitis  ? [facility] Steele Sizer, MD  ?  ?  ?Future Appointments   ?        ? In 10 months Steele Sizer, MD [facility], [place]  ?  ? ?  ?  ?  ? ? ?

## 2021-12-31 DIAGNOSIS — H524 Presbyopia: Secondary | ICD-10-CM | POA: Diagnosis not present

## 2021-12-31 DIAGNOSIS — H5213 Myopia, bilateral: Secondary | ICD-10-CM | POA: Diagnosis not present

## 2021-12-31 DIAGNOSIS — H52213 Irregular astigmatism, bilateral: Secondary | ICD-10-CM | POA: Diagnosis not present

## 2022-02-06 ENCOUNTER — Other Ambulatory Visit: Payer: Self-pay | Admitting: Family Medicine

## 2022-02-06 DIAGNOSIS — E785 Hyperlipidemia, unspecified: Secondary | ICD-10-CM

## 2022-04-23 ENCOUNTER — Other Ambulatory Visit: Payer: Self-pay | Admitting: Family Medicine

## 2022-04-23 DIAGNOSIS — F411 Generalized anxiety disorder: Secondary | ICD-10-CM

## 2022-04-23 DIAGNOSIS — F325 Major depressive disorder, single episode, in full remission: Secondary | ICD-10-CM

## 2022-04-23 DIAGNOSIS — G43019 Migraine without aura, intractable, without status migrainosus: Secondary | ICD-10-CM

## 2022-07-22 ENCOUNTER — Other Ambulatory Visit: Payer: Self-pay | Admitting: Family Medicine

## 2022-07-22 DIAGNOSIS — F325 Major depressive disorder, single episode, in full remission: Secondary | ICD-10-CM

## 2022-07-22 DIAGNOSIS — F411 Generalized anxiety disorder: Secondary | ICD-10-CM

## 2022-07-22 DIAGNOSIS — G43019 Migraine without aura, intractable, without status migrainosus: Secondary | ICD-10-CM

## 2022-07-22 NOTE — Telephone Encounter (Signed)
LVM to see if she has enough meds till her appt

## 2022-07-28 NOTE — Patient Instructions (Signed)
Preventive Care 40-52 Years Old, Female Preventive care refers to lifestyle choices and visits with your health care provider that can promote health and wellness. Preventive care visits are also called wellness exams. What can I expect for my preventive care visit? Counseling Your health care provider may ask you questions about your: Medical history, including: Past medical problems. Family medical history. Pregnancy history. Current health, including: Menstrual cycle. Method of birth control. Emotional well-being. Home life and relationship well-being. Sexual activity and sexual health. Lifestyle, including: Alcohol, nicotine or tobacco, and drug use. Access to firearms. Diet, exercise, and sleep habits. Work and work environment. Sunscreen use. Safety issues such as seatbelt and bike helmet use. Physical exam Your health care provider will check your: Height and weight. These may be used to calculate your BMI (body mass index). BMI is a measurement that tells if you are at a healthy weight. Waist circumference. This measures the distance around your waistline. This measurement also tells if you are at a healthy weight and may help predict your risk of certain diseases, such as type 2 diabetes and high blood pressure. Heart rate and blood pressure. Body temperature. Skin for abnormal spots. What immunizations do I need?  Vaccines are usually given at various ages, according to a schedule. Your health care provider will recommend vaccines for you based on your age, medical history, and lifestyle or other factors, such as travel or where you work. What tests do I need? Screening Your health care provider may recommend screening tests for certain conditions. This may include: Lipid and cholesterol levels. Diabetes screening. This is done by checking your blood sugar (glucose) after you have not eaten for a while (fasting). Pelvic exam and Pap test. Hepatitis B test. Hepatitis C  test. HIV (human immunodeficiency virus) test. STI (sexually transmitted infection) testing, if you are at risk. Lung cancer screening. Colorectal cancer screening. Mammogram. Talk with your health care provider about when you should start having regular mammograms. This may depend on whether you have a family history of breast cancer. BRCA-related cancer screening. This may be done if you have a family history of breast, ovarian, tubal, or peritoneal cancers. Bone density scan. This is done to screen for osteoporosis. Talk with your health care provider about your test results, treatment options, and if necessary, the need for more tests. Follow these instructions at home: Eating and drinking  Eat a diet that includes fresh fruits and vegetables, whole grains, lean protein, and low-fat dairy products. Take vitamin and mineral supplements as recommended by your health care provider. Do not drink alcohol if: Your health care provider tells you not to drink. You are pregnant, may be pregnant, or are planning to become pregnant. If you drink alcohol: Limit how much you have to 0-1 drink a day. Know how much alcohol is in your drink. In the [place] one drink equals one 12 oz bottle of beer (355 mL), one 5 oz glass of wine (148 mL), or one 1 oz glass of hard liquor (44 mL). Lifestyle Brush your teeth every morning and night with fluoride toothpaste. Floss one time each day. Exercise for at least 30 minutes 5 or more days each week. Do not use any products that contain nicotine or tobacco. These products include cigarettes, chewing tobacco, and vaping devices, such as e-cigarettes. If you need help quitting, ask your health care provider. Do not use drugs. If you are sexually active, practice safe sex. Use a condom or other form of protection to   prevent STIs. If you do not wish to become pregnant, use a form of birth control. If you plan to become pregnant, see your health care provider for a  prepregnancy visit. Take aspirin only as told by your health care provider. Make sure that you understand how much to take and what form to take. Work with your health care provider to find out whether it is safe and beneficial for you to take aspirin daily. Find healthy ways to manage stress, such as: Meditation, yoga, or listening to music. Journaling. Talking to a trusted person. Spending time with friends and family. Minimize exposure to UV radiation to reduce your risk of skin cancer. Safety Always wear your seat belt while driving or riding in a vehicle. Do not drive: If you have been drinking alcohol. Do not ride with someone who has been drinking. When you are tired or distracted. While texting. If you have been using any mind-altering substances or drugs. Wear a helmet and other protective equipment during sports activities. If you have firearms in your house, make sure you follow all gun safety procedures. Seek help if you have been physically or sexually abused. What's next? Visit your health care provider once a year for an annual wellness visit. Ask your health care provider how often you should have your eyes and teeth checked. Stay up to date on all vaccines. This information is not intended to replace advice given to you by your health care provider. Make sure you discuss any questions you have with your health care provider. Document Revised: 12/26/2020 Document Reviewed: 12/26/2020 Elsevier Patient Education  [place].

## 2022-07-28 NOTE — Progress Notes (Unsigned)
Name: Mikayla Fritz   MRN: [id number]    DOB: 1970/10/27   Date:07/29/2022       Progress Note  Subjective  Chief Complaint  Annual Exam  HPI  Patient presents for annual CPE.  Diet: cutting down on glucose Exercise: discussed 150 minutes per week   Last Eye Exam: up to date Last Dental Exam: up to date   [employer] Visit from 07/29/2022 in [facility]  AUDIT-C Score 0      Depression: Phq 9 is  negative    07/29/2022    7:58 AM 07/25/2021    8:03 AM 02/22/2021    7:51 AM 08/24/2020    8:32 AM 02/22/2020   11:02 AM  Depression screen PHQ 2/9  Decreased Interest 0 0 0 0 0  Down, Depressed, Hopeless 0 0 0 0 0  PHQ - 2 Score 0 0 0 0 0  Altered sleeping 0 0 0 0 1  Tired, decreased energy 0 0 0 0 2  Change in appetite 0 0 0 0 0  Feeling bad or failure about yourself  0 0 0 0 0  Trouble concentrating 0 0 0 0 0  Moving slowly or fidgety/restless 0 0 0 0 0  Suicidal thoughts 0 0 0 0 0  PHQ-9 Score 0 0 0 0 3  Difficult doing work/chores     Not difficult at all   Hypertension: BP Readings from Last 3 Encounters:  07/29/22 112/72  07/25/21 114/70  02/22/21 120/68   Obesity: Wt Readings from Last 3 Encounters:  07/29/22 158 lb (71.7 kg)  07/25/21 159 lb (72.1 kg)  02/22/21 155 lb (70.3 kg)   BMI Readings from Last 3 Encounters:  07/29/22 28.90 kg/m  07/25/21 28.17 kg/m  02/22/21 27.46 kg/m     Vaccines:   HPV: N/A Tdap: up to date  Shingrix: 1 of 2, due - she will return for second dose on a Friday  Pneumonia: up to date  Flu: today  COVID-19: discussed booster    Hep C Screening: 02/22/20 STD testing and prevention (HIV/chl/gon/syphilis): 06/19/16 Intimate partner violence: negative screen  Sexual History : no pain , but has noticed lack of desire  Menstrual History/LMP/Abnormal Bleeding: regular cycles , getting a little heavier than it used to be  Discussed importance of follow up if any post-menopausal bleeding: yes   Incontinence Symptoms: negative for symptoms   Breast cancer:  - Last Mammogram: she had mammogram in 2023 and we will try to get records  - BRCA gene screening: she had it done by gyn, there is a family history of colon cancer  - 2 aunts and one cousin with colorectal cancer.  She had a cologuard but due to her history she needs to have a colonoscopy   Osteoporosis Prevention : Discussed high calcium and vitamin D supplementation, weight bearing exercises Bone density: N/A   Cervical cancer screening: 05/02/19, due-ordered today  Skin cancer: Discussed monitoring for atypical lesions  Colorectal cancer: 03/11/21  - discussed colonoscopy  Lung cancer:  Low Dose CT Chest recommended if Age 75-80 years, 20 pack-year currently smoking OR have quit w/in 15years. Patient does not qualify for screen   ECG: 06/19/16  Advanced Care Planning: A voluntary discussion about advance care planning including the explanation and discussion of advance directives.  Discussed health care proxy and Living will, and the patient was able to identify a health care proxy as husbnad.  Patient does not have a living will  and power of attorney of health care   Lipids: Lab Results  Component Value Date   CHOL 122 07/25/2021   CHOL 217 (H) 08/24/2020   CHOL 225 (H) 02/22/2020   Lab Results  Component Value Date   HDL 42 (L) 07/25/2021   HDL 39 (L) 08/24/2020   HDL 38 (L) 02/22/2020   Lab Results  Component Value Date   LDLCALC 59 07/25/2021   LDLCALC 145 (H) 08/24/2020   LDLCALC 138 (H) 02/22/2020   Lab Results  Component Value Date   TRIG 127 07/25/2021   TRIG 191 (H) 08/24/2020   TRIG 341 (H) 02/22/2020   Lab Results  Component Value Date   CHOLHDL 2.9 07/25/2021   CHOLHDL 5.6 (H) 08/24/2020   CHOLHDL 5.9 (H) 02/22/2020   No results found for: "LDLDIRECT"  Glucose: Glucose, Bld  Date Value Ref Range Status  07/25/2021 102 (H) 65 - 99 mg/dL Final    Comment:    .            Fasting  reference interval . For someone without known diabetes, a glucose value between 100 and 125 mg/dL is consistent with prediabetes and should be confirmed with a follow-up test. .   08/24/2020 88 65 - 99 mg/dL Final    Comment:    .            Fasting reference interval .   02/22/2020 109 (H) 65 - 99 mg/dL Final    Comment:    .            Fasting reference interval . For someone without known diabetes, a glucose value between 100 and 125 mg/dL is consistent with prediabetes and should be confirmed with a follow-up test. .     Patient Active Problem List   Diagnosis Date Noted   Osteopenia 04/27/2019   Thrombocytosis 07/30/2018   Migraine without aura, intractable, without status migrainosus 10/08/2017   Mild depression 10/08/2017   Low HDL (under 40) 09/18/2016   GERD without esophagitis 06/19/2016   Hypertriglyceridemia 06/19/2016   Recurrent cystitis 06/19/2016   External hemorrhoids 08/05/2007   Perennial allergic rhinitis 08/05/2007   History of gastritis 08/05/2007   GAD (generalized anxiety disorder) 08/05/2007    Past Surgical History:  Procedure Laterality Date   TONSILLECTOMY  2002    Family History  Problem Relation Age of Onset   Heart disease Mother    Cancer Mother        Kidney   Heart attack Mother        3   Arthritis Mother    Thyroid disease Mother    Hypertension Father    Diabetes Father    Anxiety disorder Brother    Cancer Maternal Grandmother        Breast   Breast cancer Maternal Grandmother     Social History   Socioeconomic History   Marital status: Married    Spouse name: Berneta Sages    Number of children: 1   Years of education: Not on file   Highest education level: Some college, no degree  Occupational History   Not on file  Tobacco Use   Smoking status: Never   Smokeless tobacco: Never  Vaping Use   Vaping Use: Never used  Substance and Sexual Activity   Alcohol use: No   Drug use: No   Sexual activity: Yes     Partners: Male    Comment: Husband had vasectomy  Other Topics Concern  Not on file  Social History Narrative   Works at [employer], married   Social Determinants of Health   Financial Resource Strain: [place]  (07/29/2022)   Overall Financial Resource Strain (CARDIA)    Difficulty of Paying Living Expenses: Not hard at all  Food Insecurity: No Food Insecurity (07/29/2022)   Hunger Vital Sign    Worried About Running Out of Food in the Last Year: Never true    [place] in the Last Year: Never true  Transportation Needs: No Transportation Needs (07/29/2022)   PRAPARE - Hydrologist (Medical): No    Lack of Transportation (Non-Medical): No  Physical Activity: Insufficiently Active (07/29/2022)   Exercise Vital Sign    Days of Exercise per Week: 1 day    Minutes of Exercise per Session: 60 min  Stress: No Stress Concern Present (07/29/2022)   [employer]    Feeling of Stress : Only a little  Social Connections: Moderately Isolated (07/29/2022)   Social Connection and Isolation Panel [NHANES]    Frequency of Communication with Friends and Family: More than three times a week    Frequency of Social Gatherings with Friends and Family: More than three times a week    Attends Religious Services: Never    Marine scientist or Organizations: No    Attends Archivist Meetings: Never    Marital Status: Married  Human resources officer Violence: Not At Risk (07/29/2022)   Humiliation, Afraid, Rape, and Kick questionnaire    Fear of Current or Ex-Partner: No    Emotionally Abused: No    Physically Abused: No    Sexually Abused: No     Current Outpatient Medications:    albuterol (VENTOLIN HFA) 108 (90 Base) MCG/ACT inhaler, TAKE 2 PUFFS BY MOUTH EVERY 6 HOURS AS NEEDED FOR WHEEZE OR SHORTNESS OF BREATH, Disp: 8.5 each, Rfl: 11   atenolol (TENORMIN) 25 MG tablet, TAKE 1 TABLET (25  MG TOTAL) BY MOUTH DAILY., Disp: 90 tablet, Rfl: 0   omega-3 acid ethyl esters (LOVAZA) 1 g capsule, TAKE 1 CAPSULE BY MOUTH 2 TIMES DAILY., Disp: 360 capsule, Rfl: 3   rosuvastatin (CRESTOR) 10 MG tablet, TAKE 1 TABLET BY MOUTH EVERY DAY, Disp: 90 tablet, Rfl: 1   venlafaxine XR (EFFEXOR-XR) 150 MG 24 hr capsule, TAKE 1 CAPSULE BY MOUTH DAILY WITH BREAKFAST., Disp: 90 capsule, Rfl: 0   Ubrogepant (UBRELVY) 100 MG TABS, Take 1 tablet by mouth daily as needed. (Patient not taking: Reported on 07/29/2022), Disp: 16 tablet, Rfl: 0  Allergies  Allergen Reactions   Clindamycin/Lincomycin Itching, Nausea Only and Other (See Comments)    Stomach aches and pain   Codeine     REACTION: makes feel loopy     ROS  Constitutional: Negative for fever or weight change.  Respiratory: Negative for cough and shortness of breath.   Cardiovascular: Negative for chest pain or palpitations.  Gastrointestinal: Negative for abdominal pain, no bowel changes.  Musculoskeletal: Negative for gait problem or joint swelling.  Skin: Negative for rash.  Neurological: Negative for dizziness or headache.  No other specific complaints in a complete review of systems (except as listed in HPI above).   Objective  Vitals:   07/29/22 0758  BP: 112/72  Pulse: 96  Resp: 16  SpO2: 99%  Weight: 158 lb (71.7 kg)  Height: '5\' 2"'$  (1.575 m)    Body mass index is  28.9 kg/m.  Physical Exam  Constitutional: Patient appears well-developed and well-nourished. No distress.  HENT: Head: Normocephalic and atraumatic. Ears: B TMs ok, no erythema or effusion; Nose: Nose normal. Mouth/Throat: Oropharynx is clear and moist. No oropharyngeal exudate.  Eyes: Conjunctivae and EOM are normal. Pupils are equal, round, and reactive to light. No scleral icterus.  Neck: Normal range of motion. Neck supple. No JVD present. No thyromegaly present.  Cardiovascular: Normal rate, regular rhythm and normal heart sounds.  No murmur heard. No  BLE edema. Pulmonary/Chest: Effort normal and breath sounds normal. No respiratory distress. Abdominal: Soft. Bowel sounds are normal, no distension. There is no tenderness. no masses Breast: no lumps or masses, no nipple discharge or rashes FEMALE GENITALIA:  Not done  RECTAL: not done  Musculoskeletal: Normal range of motion, no joint effusions. No gross deformities Neurological: he is alert and oriented to person, place, and time. No cranial nerve deficit. Coordination, balance, strength, speech and gait are normal.  Skin: Skin is warm and dry. No rash noted. No erythema.  Psychiatric: Patient has a normal mood and affect. behavior is normal. Judgment and thought content normal.  Fall Risk:    07/29/2022    7:58 AM 07/25/2021    8:03 AM 02/22/2021    7:51 AM 08/24/2020    8:32 AM 02/22/2020   11:02 AM  Fall Risk   Falls in the past year? 0 0 0 0 0  Number falls in past yr: 0 0 0 0 0  Injury with Fall? 0 0 0 0 0  Risk for fall due to : No Fall Risks No Fall Risks     Follow up Falls prevention discussed Falls prevention discussed        Functional Status Survey: Is the patient deaf or have difficulty hearing?: No Does the patient have difficulty seeing, even when wearing glasses/contacts?: No Does the patient have difficulty concentrating, remembering, or making decisions?: No Does the patient have difficulty walking or climbing stairs?: No Does the patient have difficulty dressing or bathing?: No Does the patient have difficulty doing errands alone such as visiting a doctor's office or shopping?: No   Assessment & Plan  1. Well adult exam  - Lipid panel - CBC with Differential/Platelet - COMPLETE METABOLIC PANEL WITH GFR - Hemoglobin A1c  2. Need for immunization against influenza  - Flu Vaccine QUAD 6+ mos PF IM (Fluarix Quad PF)  3. Long-term use of high-risk medication  - CBC with Differential/Platelet - COMPLETE METABOLIC PANEL WITH GFR  4. Pre-diabetes  -  Hemoglobin A1c  5. Dyslipidemia  - Lipid panel    -USPSTF grade A and B recommendations reviewed with patient; age-appropriate recommendations, preventive care, screening tests, etc discussed and encouraged; healthy living encouraged; see AVS for patient education given to patient -Discussed importance of 150 minutes of physical activity weekly, eat two servings of fish weekly, eat one serving of tree nuts ( cashews, pistachios, pecans, almonds.Marland Kitchen) every other day, eat 6 servings of fruit/vegetables daily and drink plenty of water and avoid sweet beverages.   -Reviewed Health Maintenance: Yes.

## 2022-07-29 ENCOUNTER — Ambulatory Visit (INDEPENDENT_AMBULATORY_CARE_PROVIDER_SITE_OTHER): Payer: BC Managed Care – PPO | Admitting: Family Medicine

## 2022-07-29 ENCOUNTER — Encounter: Payer: Self-pay | Admitting: Family Medicine

## 2022-07-29 VITALS — BP 112/72 | HR 96 | Resp 16 | Ht 62.0 in | Wt 158.0 lb

## 2022-07-29 DIAGNOSIS — Z79899 Other long term (current) drug therapy: Secondary | ICD-10-CM

## 2022-07-29 DIAGNOSIS — E785 Hyperlipidemia, unspecified: Secondary | ICD-10-CM | POA: Diagnosis not present

## 2022-07-29 DIAGNOSIS — R7303 Prediabetes: Secondary | ICD-10-CM

## 2022-07-29 DIAGNOSIS — Z23 Encounter for immunization: Secondary | ICD-10-CM | POA: Diagnosis not present

## 2022-07-29 DIAGNOSIS — Z124 Encounter for screening for malignant neoplasm of cervix: Secondary | ICD-10-CM

## 2022-07-29 DIAGNOSIS — Z0000 Encounter for general adult medical examination without abnormal findings: Secondary | ICD-10-CM

## 2022-07-29 DIAGNOSIS — Z1231 Encounter for screening mammogram for malignant neoplasm of breast: Secondary | ICD-10-CM

## 2022-07-29 NOTE — Addendum Note (Signed)
Addended by: Steele Sizer F on: 07/29/2022 11:27 AM   Modules accepted: Orders

## 2022-07-30 ENCOUNTER — Other Ambulatory Visit: Payer: Self-pay | Admitting: Family Medicine

## 2022-07-30 DIAGNOSIS — E785 Hyperlipidemia, unspecified: Secondary | ICD-10-CM

## 2022-07-30 LAB — HEMOGLOBIN A1C
Hgb A1c MFr Bld: 5.8 % of total Hgb — ABNORMAL HIGH (ref <5.7)
Mean Plasma Glucose: 120 mg/dL
eAG (mmol/L): 6.6 mmol/L

## 2022-07-30 LAB — CBC WITH DIFFERENTIAL/PLATELET
Absolute Monocytes: 589 cells/uL (ref 200–950)
Basophils Absolute: 29 cells/uL (ref 0–200)
Basophils Relative: 0.3 %
Eosinophils Absolute: 95 cells/uL (ref 15–500)
Eosinophils Relative: 1 %
HCT: 37.7 % (ref 35.0–45.0)
Hemoglobin: 13.1 g/dL (ref 11.7–15.5)
Lymphs Abs: 3031 cells/uL (ref 850–3900)
MCH: 30.7 pg (ref 27.0–33.0)
MCHC: 34.7 g/dL (ref 32.0–36.0)
MCV: 88.3 fL (ref 80.0–100.0)
MPV: 9.3 fL (ref 7.5–12.5)
Monocytes Relative: 6.2 %
Neutro Abs: 5757 cells/uL (ref 1500–7800)
Neutrophils Relative %: 60.6 %
Platelets: 349 10*3/uL (ref 140–400)
RBC: 4.27 10*6/uL (ref 3.80–5.10)
RDW: 12.1 % (ref 11.0–15.0)
Total Lymphocyte: 31.9 %
WBC: 9.5 10*3/uL (ref 3.8–10.8)

## 2022-07-30 LAB — COMPLETE METABOLIC PANEL WITH GFR
AG Ratio: 2.1 (calc) (ref 1.0–2.5)
ALT: 29 U/L (ref 6–29)
AST: 21 U/L (ref 10–35)
Albumin: 4.4 g/dL (ref 3.6–5.1)
Alkaline phosphatase (APISO): 70 U/L (ref 37–153)
BUN: 10 mg/dL (ref 7–25)
CO2: 27 mmol/L (ref 20–32)
Calcium: 9.3 mg/dL (ref 8.6–10.4)
Chloride: 104 mmol/L (ref 98–110)
Creat: 0.63 mg/dL (ref 0.50–1.03)
Globulin: 2.1 g/dL (calc) (ref 1.9–3.7)
Glucose, Bld: 105 mg/dL — ABNORMAL HIGH (ref 65–99)
Potassium: 4.1 mmol/L (ref 3.5–5.3)
Sodium: 139 mmol/L (ref 135–146)
Total Bilirubin: 0.5 mg/dL (ref 0.2–1.2)
Total Protein: 6.5 g/dL (ref 6.1–8.1)
eGFR: 107 mL/min/{1.73_m2} (ref >=60)

## 2022-07-30 LAB — LIPID PANEL
Cholesterol: 114 mg/dL (ref <200)
HDL: 37 mg/dL — ABNORMAL LOW (ref >=50)
LDL Cholesterol (Calc): 50 mg/dL (calc)
Non-HDL Cholesterol (Calc): 77 mg/dL (calc) (ref <130)
Total CHOL/HDL Ratio: 3.1 (calc) (ref <5.0)
Triglycerides: 208 mg/dL — ABNORMAL HIGH (ref <150)

## 2022-07-30 NOTE — Telephone Encounter (Signed)
Requested Prescriptions  Pending Prescriptions Disp Refills   rosuvastatin (CRESTOR) 10 MG tablet [Pharmacy Med Name: ROSUVASTATIN CALCIUM 10 MG TAB] 90 tablet 1    Sig: TAKE 1 TABLET BY MOUTH EVERY DAY     Cardiovascular:  Antilipid - Statins 2 Failed - 07/30/2022 12:32 PM      Failed - Lipid Panel in normal range within the last 12 months    Cholesterol  Date Value Ref Range Status  07/29/2022 114 <200 mg/dL Final   LDL Cholesterol (Calc)  Date Value Ref Range Status  07/29/2022 50 mg/dL (calc) Final    Comment:    Reference range: <100 . Desirable range <100 mg/dL for primary prevention;   <70 mg/dL for patients with CHD or diabetic patients  with > or = 2 CHD risk factors. Marland Kitchen LDL-C is now calculated using the Martin-Hopkins  calculation, which is a validated novel method providing  better accuracy than the Friedewald equation in the  estimation of LDL-C.  Cresenciano Genre et al. Annamaria Helling. 4562;563(89): 2061-2068  (http://education.[facility].com/faq/FAQ164)    HDL  Date Value Ref Range Status  07/29/2022 37 (L) > OR = 50 mg/dL Final   Triglycerides  Date Value Ref Range Status  07/29/2022 208 (H) <150 mg/dL Final    Comment:    . If a non-fasting specimen was collected, consider repeat triglyceride testing on a fasting specimen if clinically indicated.  Yates Decamp et al. J. of Clin. Lipidol. 3734;2:876-811. Marland Kitchen          Passed - Cr in normal range and within 360 days    Creat  Date Value Ref Range Status  07/29/2022 0.63 0.50 - 1.03 mg/dL Final         Passed - Patient is not pregnant      Passed - Valid encounter within last 12 months    Recent Outpatient Visits           Yesterday Well adult exam   [facility] Steele Sizer, MD   1 year ago Well adult exam   [facility] Steele Sizer, MD   1 year ago Dyslipidemia   [facility] Steele Sizer, MD   1 year ago Dyslipidemia   [facility] Steele Sizer, MD   2 years ago Depression, major, in remission [facility])   [facility] Steele Sizer, MD       Future Appointments             In 6 months Ancil Boozer, Drue Stager, MD [facility], [place]   In 1 year Steele Sizer, MD [facility], [facility]

## 2022-08-11 ENCOUNTER — Other Ambulatory Visit: Payer: Self-pay | Admitting: Family Medicine

## 2022-08-11 DIAGNOSIS — G43019 Migraine without aura, intractable, without status migrainosus: Secondary | ICD-10-CM

## 2022-09-17 DIAGNOSIS — R92323 Mammographic fibroglandular density, bilateral breasts: Secondary | ICD-10-CM | POA: Diagnosis not present

## 2022-09-17 DIAGNOSIS — N6001 Solitary cyst of right breast: Secondary | ICD-10-CM | POA: Diagnosis not present

## 2022-09-17 DIAGNOSIS — R928 Other abnormal and inconclusive findings on diagnostic imaging of breast: Secondary | ICD-10-CM | POA: Diagnosis not present

## 2022-09-17 DIAGNOSIS — R92321 Mammographic fibroglandular density, right breast: Secondary | ICD-10-CM | POA: Diagnosis not present

## 2022-09-17 LAB — HM MAMMOGRAPHY

## 2022-09-19 ENCOUNTER — Other Ambulatory Visit: Payer: Self-pay | Admitting: Family Medicine

## 2022-09-19 DIAGNOSIS — F411 Generalized anxiety disorder: Secondary | ICD-10-CM

## 2022-09-19 DIAGNOSIS — F325 Major depressive disorder, single episode, in full remission: Secondary | ICD-10-CM

## 2022-10-01 ENCOUNTER — Encounter: Payer: Self-pay | Admitting: Family Medicine

## 2022-10-02 ENCOUNTER — Other Ambulatory Visit: Payer: Self-pay

## 2022-10-02 DIAGNOSIS — J4520 Mild intermittent asthma, uncomplicated: Secondary | ICD-10-CM

## 2022-10-02 MED ORDER — ALBUTEROL SULFATE HFA 108 (90 BASE) MCG/ACT IN AERS: INHALATION_SPRAY | RESPIRATORY_TRACT | 11 refills | Status: DC

## 2022-10-21 DIAGNOSIS — Z6828 Body mass index (BMI) 28.0-28.9, adult: Secondary | ICD-10-CM | POA: Diagnosis not present

## 2022-10-21 DIAGNOSIS — Z01419 Encounter for gynecological examination (general) (routine) without abnormal findings: Secondary | ICD-10-CM | POA: Diagnosis not present

## 2022-12-06 ENCOUNTER — Other Ambulatory Visit: Payer: Self-pay | Admitting: Family Medicine

## 2022-12-06 DIAGNOSIS — F411 Generalized anxiety disorder: Secondary | ICD-10-CM

## 2022-12-06 DIAGNOSIS — F325 Major depressive disorder, single episode, in full remission: Secondary | ICD-10-CM

## 2023-01-22 ENCOUNTER — Other Ambulatory Visit: Payer: Self-pay | Admitting: Family Medicine

## 2023-01-22 DIAGNOSIS — G43019 Migraine without aura, intractable, without status migrainosus: Secondary | ICD-10-CM

## 2023-01-22 DIAGNOSIS — E785 Hyperlipidemia, unspecified: Secondary | ICD-10-CM

## 2023-01-26 NOTE — Progress Notes (Signed)
Name: Mikayla Fritz   MRN: [id number]    DOB: 1971/03/18   Date:01/27/2023       Progress Note  Subjective  Chief Complaint  Follow Up  HPI  Migraine headache: she is doing very well since started on Atenolol Summer 2020. Migraines used to be prior to her cycles and it was  associated with nausea , phonophobia and photophobia, usually frontal sometimes nuchal and throbbing. She does not like using Imitrex due to side effects, makes her loopy, she has Ubrelvy at home but has not used it yet . She states no problems in months    Major Depression and GAD: she tried going down from Effexor 150 to 75 , but when she tried to go down on the dose she felt irritable. She only has difficulty falling asleep at times, she has been taking Melatonin and is working well for her. She will call back in the Fall if she develops seasonal affective symptoms we can add 37.5 or 75 mg dose to the winter months   GERD: also history of gastritis, she is doing well, occasionally takes tums and or omeprazole   Dyslipidemia: she is taking Lovaza two capsules twice daily, she states her mother had 4 heart attacks and first one around age 31, her ASCVD score is low but due to family history she has been taking statin therapy and last LDL was at goal at 50    Prediabetes: based on increase in abdominal girth, elevated bp on her last visit , also high triglycerides and low HDL. She is not consistent with diet, she is able to lose weight but than gains in back, discussed cutting carbohydrates in moderation and increase activity after meals    RAD: she states since childhood she has noticed problems breathing. She states she has never been evaluated for asthma. She has to use inhaler when laughing hard, also with physical activity. She states also triggered during allergy season   Patient Active Problem List   Diagnosis Date Noted   Osteopenia 04/27/2019   Thrombocytosis 07/30/2018   Migraine without aura, intractable,  without status migrainosus 10/08/2017   Mild depression 10/08/2017   Low HDL (under 40) 09/18/2016   GERD without esophagitis 06/19/2016   Hypertriglyceridemia 06/19/2016   Recurrent cystitis 06/19/2016   External hemorrhoids 08/05/2007   Perennial allergic rhinitis 08/05/2007   History of gastritis 08/05/2007   GAD (generalized anxiety disorder) 08/05/2007    Past Surgical History:  Procedure Laterality Date   TONSILLECTOMY  2002    Family History  Problem Relation Age of Onset   Heart disease Mother    Cancer Mother        Kidney   Heart attack Mother        3   Arthritis Mother    Thyroid disease Mother    Hypertension Father    Diabetes Father    Anxiety disorder Brother    Cancer Maternal Grandmother        Breast   Breast cancer Maternal Grandmother     Social History   Tobacco Use   Smoking status: Never   Smokeless tobacco: Never  Substance Use Topics   Alcohol use: No     Current Outpatient Medications:    albuterol (VENTOLIN HFA) 108 (90 Base) MCG/ACT inhaler, TAKE 2 PUFFS BY MOUTH EVERY 6 HOURS AS NEEDED FOR WHEEZE OR SHORTNESS OF BREATH, Disp: 8.5 each, Rfl: 11   atenolol (TENORMIN) 25 MG tablet, TAKE 1 TABLET (25 MG TOTAL) BY  MOUTH DAILY., Disp: 90 tablet, Rfl: 1   omega-3 acid ethyl esters (LOVAZA) 1 g capsule, TAKE 1 CAPSULE BY MOUTH 2 TIMES DAILY., Disp: 360 capsule, Rfl: 3   rosuvastatin (CRESTOR) 10 MG tablet, TAKE 1 TABLET BY MOUTH EVERY DAY, Disp: 90 tablet, Rfl: 1   venlafaxine XR (EFFEXOR-XR) 150 MG 24 hr capsule, TAKE 1 CAPSULE BY MOUTH DAILY WITH BREAKFAST., Disp: 90 capsule, Rfl: 0   Ubrogepant (UBRELVY) 100 MG TABS, Take 1 tablet by mouth daily as needed. (Patient not taking: Reported on 01/27/2023), Disp: 16 tablet, Rfl: 0  Allergies  Allergen Reactions   Clindamycin/Lincomycin Itching, Nausea Only and Other (See Comments)    Stomach aches and pain   Codeine     REACTION: makes feel loopy    I personally reviewed active problem  list, medication list, allergies, family history, social history, health maintenance with the patient/caregiver today.   ROS  Ten systems reviewed and is negative except as mentioned in HPI    Objective  Vitals:   01/27/23 0756  BP: 122/70  Pulse: 93  Resp: 16  SpO2: 99%  Weight: 161 lb (73 kg)  Height: 5\' 2"  (1.575 m)    Body mass index is 29.45 kg/m.  Physical Exam  Constitutional: Patient appears well-developed and well-nourished.  No distress.  HEENT: head atraumatic, normocephalic, pupils equal and reactive to light, neck supple Cardiovascular: Normal rate, regular rhythm and normal heart sounds.  No murmur heard. No BLE edema. Pulmonary/Chest: Effort normal and breath sounds normal. No respiratory distress. Abdominal: Soft.  There is no tenderness. Psychiatric: Patient has a normal mood and affect. behavior is normal. Judgment and thought content normal.    PHQ2/9:    01/27/2023    7:55 AM 07/29/2022    7:58 AM 07/25/2021    8:03 AM 02/22/2021    7:51 AM 08/24/2020    8:32 AM  Depression screen PHQ 2/9  Decreased Interest 0 0 0 0 0  Down, Depressed, Hopeless 0 0 0 0 0  PHQ - 2 Score 0 0 0 0 0  Altered sleeping 0 0 0 0 0  Tired, decreased energy 0 0 0 0 0  Change in appetite 0 0 0 0 0  Feeling bad or failure about yourself  0 0 0 0 0  Trouble concentrating 0 0 0 0 0  Moving slowly or fidgety/restless 0 0 0 0 0  Suicidal thoughts 0 0 0 0 0  PHQ-9 Score 0 0 0 0 0    phq 9 is negative   Fall Risk:    01/27/2023    7:55 AM 07/29/2022    7:58 AM 07/25/2021    8:03 AM 02/22/2021    7:51 AM 08/24/2020    8:32 AM  Fall Risk   Falls in the past year? 0 0 0 0 0  Number falls in past yr: 0 0 0 0 0  Injury with Fall? 0 0 0 0 0  Risk for fall due to : No Fall Risks No Fall Risks No Fall Risks    Follow up Falls prevention discussed Falls prevention discussed Falls prevention discussed        Functional Status Survey: Is the patient deaf or have difficulty  hearing?: No Does the patient have difficulty seeing, even when wearing glasses/contacts?: No Does the patient have difficulty concentrating, remembering, or making decisions?: No Does the patient have difficulty walking or climbing stairs?: No Does the patient have difficulty dressing or bathing?: No Does  the patient have difficulty doing errands alone such as visiting a doctor's office or shopping?: No    Assessment & Plan  1. Depression, major, in remission (HCC)  - venlafaxine XR (EFFEXOR-XR) 150 MG 24 hr capsule; Take 1 capsule (150 mg total) by mouth daily with breakfast.  Dispense: 90 capsule; Refill: 1  2. GERD without esophagitis  Taking prn medication  3. Migraine without aura, intractable, without status migrainosus  Doing well on atenolol   4. Perennial allergic rhinitis  Continue prn medication   5. Pre-diabetes  Discussed life style modification   6. Dyslipidemia  - omega-3 acid ethyl esters (LOVAZA) 1 g capsule; Take 1 capsule (1 g total) by mouth 2 (two) times daily.  Dispense: 180 capsule; Refill: 1  7. Vitamin D deficiency  Taking vitamin D otc  8. Mild intermittent reactive airway disease without complication  Taking albuterol   9. Hypertriglyceridemia  - omega-3 acid ethyl esters (LOVAZA) 1 g capsule; Take 1 capsule (1 g total) by mouth 2 (two) times daily.  Dispense: 180 capsule; Refill: 1  10. GAD (generalized anxiety disorder)  - venlafaxine XR (EFFEXOR-XR) 150 MG 24 hr capsule; Take 1 capsule (150 mg total) by mouth daily with breakfast.  Dispense: 90 capsule; Refill: 1

## 2023-01-27 ENCOUNTER — Ambulatory Visit (INDEPENDENT_AMBULATORY_CARE_PROVIDER_SITE_OTHER): Payer: BC Managed Care – PPO | Admitting: Family Medicine

## 2023-01-27 ENCOUNTER — Encounter: Payer: Self-pay | Admitting: Family Medicine

## 2023-01-27 VITALS — BP 122/70 | HR 93 | Resp 16 | Ht 62.0 in | Wt 161.0 lb

## 2023-01-27 DIAGNOSIS — R7303 Prediabetes: Secondary | ICD-10-CM

## 2023-01-27 DIAGNOSIS — E559 Vitamin D deficiency, unspecified: Secondary | ICD-10-CM

## 2023-01-27 DIAGNOSIS — J3089 Other allergic rhinitis: Secondary | ICD-10-CM | POA: Diagnosis not present

## 2023-01-27 DIAGNOSIS — J45909 Unspecified asthma, uncomplicated: Secondary | ICD-10-CM | POA: Insufficient documentation

## 2023-01-27 DIAGNOSIS — F411 Generalized anxiety disorder: Secondary | ICD-10-CM

## 2023-01-27 DIAGNOSIS — K219 Gastro-esophageal reflux disease without esophagitis: Secondary | ICD-10-CM

## 2023-01-27 DIAGNOSIS — E781 Pure hyperglyceridemia: Secondary | ICD-10-CM

## 2023-01-27 DIAGNOSIS — F325 Major depressive disorder, single episode, in full remission: Secondary | ICD-10-CM | POA: Diagnosis not present

## 2023-01-27 DIAGNOSIS — J4520 Mild intermittent asthma, uncomplicated: Secondary | ICD-10-CM

## 2023-01-27 DIAGNOSIS — G43019 Migraine without aura, intractable, without status migrainosus: Secondary | ICD-10-CM

## 2023-01-27 DIAGNOSIS — E785 Hyperlipidemia, unspecified: Secondary | ICD-10-CM

## 2023-01-27 MED ORDER — OMEGA-3-ACID ETHYL ESTERS 1 G PO CAPS: 1.0000 | ORAL_CAPSULE | Freq: Two times a day (BID) | ORAL | 1 refills | Status: DC

## 2023-01-27 MED ORDER — VENLAFAXINE HCL ER 150 MG PO CP24: 150.0000 mg | ORAL_CAPSULE | Freq: Every day | ORAL | 1 refills | Status: DC

## 2023-03-23 ENCOUNTER — Other Ambulatory Visit: Payer: Self-pay | Admitting: Medical Genetics

## 2023-03-23 DIAGNOSIS — Z006 Encounter for examination for normal comparison and control in clinical research program: Secondary | ICD-10-CM

## 2023-05-01 ENCOUNTER — Other Ambulatory Visit
Admission: RE | Admit: 2023-05-01 | Discharge: 2023-05-01 | Disposition: A | Discharge status: Home / Self Care | Payer: Self-pay | Source: Ambulatory Visit | Attending: Medical Genetics | Admitting: Medical Genetics

## 2023-05-01 DIAGNOSIS — Z006 Encounter for examination for normal comparison and control in clinical research program: Secondary | ICD-10-CM | POA: Insufficient documentation

## 2023-05-12 LAB — HELIX MOLECULAR SCREEN: Genetic Analysis Overall Interpretation: NEGATIVE

## 2023-06-29 ENCOUNTER — Ambulatory Visit: Payer: BC Managed Care – PPO | Admitting: Family Medicine

## 2023-07-17 ENCOUNTER — Other Ambulatory Visit: Payer: Self-pay | Admitting: Family Medicine

## 2023-07-17 ENCOUNTER — Encounter: Payer: Self-pay | Admitting: Family Medicine

## 2023-07-17 DIAGNOSIS — G43019 Migraine without aura, intractable, without status migrainosus: Secondary | ICD-10-CM

## 2023-07-17 DIAGNOSIS — E785 Hyperlipidemia, unspecified: Secondary | ICD-10-CM

## 2023-07-20 ENCOUNTER — Other Ambulatory Visit: Payer: Self-pay | Admitting: Family Medicine

## 2023-07-20 DIAGNOSIS — F411 Generalized anxiety disorder: Secondary | ICD-10-CM

## 2023-07-20 DIAGNOSIS — F325 Major depressive disorder, single episode, in full remission: Secondary | ICD-10-CM

## 2023-07-21 ENCOUNTER — Encounter: Payer: Self-pay | Admitting: Family Medicine

## 2023-07-21 ENCOUNTER — Ambulatory Visit: Payer: BC Managed Care – PPO | Admitting: Family Medicine

## 2023-07-21 VITALS — BP 124/76 | HR 96 | Temp 97.9°F | Resp 16 | Ht 62.0 in | Wt 163.7 lb

## 2023-07-21 DIAGNOSIS — G43019 Migraine without aura, intractable, without status migrainosus: Secondary | ICD-10-CM | POA: Diagnosis not present

## 2023-07-21 DIAGNOSIS — E559 Vitamin D deficiency, unspecified: Secondary | ICD-10-CM

## 2023-07-21 DIAGNOSIS — F411 Generalized anxiety disorder: Secondary | ICD-10-CM

## 2023-07-21 DIAGNOSIS — K219 Gastro-esophageal reflux disease without esophagitis: Secondary | ICD-10-CM

## 2023-07-21 DIAGNOSIS — E785 Hyperlipidemia, unspecified: Secondary | ICD-10-CM | POA: Diagnosis not present

## 2023-07-21 DIAGNOSIS — F325 Major depressive disorder, single episode, in full remission: Secondary | ICD-10-CM | POA: Diagnosis not present

## 2023-07-21 DIAGNOSIS — E781 Pure hyperglyceridemia: Secondary | ICD-10-CM

## 2023-07-21 DIAGNOSIS — R7303 Prediabetes: Secondary | ICD-10-CM | POA: Diagnosis not present

## 2023-07-21 DIAGNOSIS — J4520 Mild intermittent asthma, uncomplicated: Secondary | ICD-10-CM

## 2023-07-21 DIAGNOSIS — J3089 Other allergic rhinitis: Secondary | ICD-10-CM

## 2023-07-21 DIAGNOSIS — Z23 Encounter for immunization: Secondary | ICD-10-CM

## 2023-07-21 MED ORDER — VENLAFAXINE HCL ER 75 MG PO CP24
75.0000 mg | ORAL_CAPSULE | Freq: Every day | ORAL | 1 refills | Status: DC
Start: 2023-07-21 — End: 2024-01-19

## 2023-07-21 MED ORDER — VENLAFAXINE HCL ER 37.5 MG PO CP24
37.5000 mg | ORAL_CAPSULE | Freq: Every day | ORAL | 0 refills | Status: DC
Start: 2023-07-21 — End: 2023-10-16

## 2023-07-21 MED ORDER — OMEGA-3-ACID ETHYL ESTERS 1 G PO CAPS: 1.0000 | ORAL_CAPSULE | Freq: Two times a day (BID) | ORAL | 1 refills | Status: DC

## 2023-07-21 NOTE — Progress Notes (Signed)
 Name: Mikayla Fritz   MRN: [id number]    DOB: 26-Jun-1971   Date:07/21/2023       Progress Note  Subjective  Chief Complaint  Chief Complaint  Patient presents with   Medical Management of Chronic Issues    HPI  Migraine headache: she is doing very well since started on Atenolol  Summer 2020. Migraines used to be prior to her cycles and it was  associated with nausea , phonophobia and photophobia, usually frontal sometimes nuchal and throbbing. She has Ubrelvy  and Imitrex  but no episodes in a long time.   Perimenopausal: skipping cycles now, not very heay   Major Depression and GAD: she tried going down from Effexor  150 to 75 , but when she tried to go down on the dose she felt irritable. She only has difficulty falling asleep at times, she has been taking Melatonin and is working well for her. She would like to try coming off medication because of flat affect. We will try giving her a 75 mg dose plus 37.5 mg to try going down slowly   GERD: also history of gastritis, she is doing well, occasionally takes tums and or omeprazole , she has noticed some diarrhea when having a lot of certain types of food. She states only an intolerance, no blood , no vomiting or nausea associated with it.    Dyslipidemia: she is taking Lovaza  two capsules twice daily, she states her mother had 4 heart attacks and first one around age 53, her ASCVD score is low but due to family history she has been taking statin therapy and last LDL was at goal at 50 . EKG done today and it was normal    Prediabetes: based on increase in abdominal girth, elevated bp on her last visit , also high triglycerides and low HDL. No as compliant with diet lately due to the holidays    RAD: she states since childhood she has noticed problems breathing. She states she has never been evaluated for asthma. She has to use inhaler when laughing hard, also with physical activity. She states also triggered during allergy season Symptoms are  stable  Patient Active Problem List   Diagnosis Date Noted   Depression, major, in remission (HCC) 01/27/2023   Reactive airway disease 01/27/2023   Vitamin D  deficiency 01/27/2023   Dyslipidemia 01/27/2023   Osteopenia 04/27/2019   Thrombocytosis 07/30/2018   Migraine without aura, intractable, without status migrainosus 10/08/2017   Low HDL (under 40) 09/18/2016   GERD without esophagitis 06/19/2016   Hypertriglyceridemia 06/19/2016   Recurrent cystitis 06/19/2016   External hemorrhoids 08/05/2007   Perennial allergic rhinitis 08/05/2007   History of gastritis 08/05/2007   GAD (generalized anxiety disorder) 08/05/2007    Past Surgical History:  Procedure Laterality Date   TONSILLECTOMY  2002    Family History  Problem Relation Age of Onset   Heart disease Mother    Cancer Mother        Kidney   Heart attack Mother        3   Arthritis Mother    Thyroid disease Mother    Hypertension Father    Diabetes Father    Anxiety disorder Brother    Cancer Maternal Grandmother        Breast   Breast cancer Maternal Grandmother     Social History   Tobacco Use   Smoking status: Never   Smokeless tobacco: Never  Substance Use Topics   Alcohol use: No     Current  Outpatient Medications:    albuterol  (VENTOLIN  HFA) 108 (90 Base) MCG/ACT inhaler, TAKE 2 PUFFS BY MOUTH EVERY 6 HOURS AS NEEDED FOR WHEEZE OR SHORTNESS OF BREATH, Disp: 8.5 each, Rfl: 11   atenolol  (TENORMIN ) 25 MG tablet, TAKE 1 TABLET (25 MG TOTAL) BY MOUTH DAILY., Disp: 90 tablet, Rfl: 1   omega-3 acid ethyl esters (LOVAZA ) 1 g capsule, Take 1 capsule (1 g total) by mouth 2 (two) times daily., Disp: 180 capsule, Rfl: 1   rosuvastatin  (CRESTOR ) 10 MG tablet, TAKE 1 TABLET BY MOUTH EVERY DAY, Disp: 90 tablet, Rfl: 1   venlafaxine  XR (EFFEXOR -XR) 150 MG 24 hr capsule, Take 1 capsule (150 mg total) by mouth daily with breakfast., Disp: 90 capsule, Rfl: 1   Ubrogepant  (UBRELVY ) 100 MG TABS, Take 1 tablet by  mouth daily as needed. (Patient not taking: Reported on 07/21/2023), Disp: 16 tablet, Rfl: 0  Allergies  Allergen Reactions   Clindamycin/Lincomycin Itching, Nausea Only and Other (See Comments)    Stomach aches and pain   Codeine     REACTION: makes feel loopy    I personally reviewed active problem list, medication list, allergies, family history with the patient/caregiver today.   ROS  Ten systems reviewed and is negative except as mentioned in HPI    Objective  Vitals:   07/21/23 1413  BP: 124/76  Pulse: 96  Resp: 16  Temp: 97.9 F (36.6 C)  TempSrc: Oral  SpO2: 97%  Weight: 163 lb 11.2 oz (74.3 kg)  Height: 5' 2 (1.575 m)    Body mass index is 29.94 kg/m.  Physical Exam  Constitutional: Patient appears well-developed and well-nourished. No distress.  HEENT: head atraumatic, normocephalic, pupils equal and reactive to light, neck supple, Cardiovascular: Normal rate, regular rhythm and normal heart sounds.  No murmur heard. No BLE edema. Pulmonary/Chest: Effort normal and breath sounds normal. No respiratory distress. Abdominal: Soft.  There is no tenderness. Psychiatric: Patient has a normal mood and affect. behavior is normal. Judgment and thought content normal.   Recent Results (from the past 2160 hours)  [facility] Molecular Screen- Blood ([facility])     Status: None   Collection Time: 05/01/23  9:05 AM  Result Value Ref Range   Genetic Analysis Overall Interpretation Negative    Genetic Disease Assessed      [facility] Tier One Population Screen is a screening test that analyzes 11 genes related to hereditary breast and ovarian cancer (HBOC) syndrome, Lynch syndrome, and familial hypercholesterolemia. This test only reports clinically significant pathogenic and  likely pathogenic variants but does not report variants of uncertain significance (VUS). In addition, analysis of the PMS2 gene excludes exons 11-15, which overlap with a known pseudogene  (PMS2CL).    Genetic Analysis Report      No pathogenic or likely pathogenic variants were detected in the genes analyzed by this test.Genetic test results should be interpreted in the context of an individual's personal medical and family history. Alteration to medical management is NOT  recommended based solely on this result. Clinical correlation is advised.Additional Considerations- This is a screening test; individuals may still carry pathogenic or likely pathogenic variant(s) in the tested genes that are not detected by this test.-  For individuals at risk for these or other related conditions based on factors including personal or family history, diagnostic testing is recommended.- The absence of pathogenic or likely pathogenic variant(s) in the analyzed genes, while reassuring,  does not eliminate the possibility of a hereditary  condition; there are other variants and genes associated with heart disease and hereditary cancer that are not included in this test.    Genes Tested See Notes     Comment: APOB, BRCA1, BRCA2, EPCAM, LDLR, LDLRAP1, PCSK9, PMS2, MLH1, MSH2, MSH6   Disclaimer See Notes     Comment: This test was developed and validated by [facility] This test has not been cleared or approved by the [place]  Food and Drug Administration (FDA). The [facility] laboratory is accredited by the College of American Pathologists (CAP) and certified under  the Clinical Laboratory Improvement Amendments (CLIA #: [id number]) to perform high-complexity clinical tests. This test is used for clinical purposes. It should not be regarded as investigational or for research.    Sequencing Location See Notes     Comment: Sequencing done at [facility] [address], [address], [place] [address] (CLIA# [id number])   Interpretation Methods and Limitations See Notes     Comment: Extracted DNA is enriched for targeted regions and then sequenced using the [facility] Exome+ (R) assay on an Illumina  DNA sequencing system. Data is then aligned to a modified version of GRCh38 and all genes are analyzed using the MANE transcript and MANE  Plus Clinical transcript, when available. Small variant calling is completed using a customized version of [employer]'s DNAseq software, augmented by a proprietary small variant caller for difficult variants. Copy number variants (CNVs) are then called  using a proprietary bioinformatics pipeline based on depth analysis with a comparison to similarly sequenced samples. Analysis of the PMS2 gene is limited to exons 1-10. The interpretation and reporting of variants in APOB, PCSK9, and LDLR is specific to  familial hypercholesterolemia; variants associated with hypobetalipoproteinemia are not included. Interpretation is based upon guidelines published by the [employer] and [employer]) and the Association for  Molecular  Pathology (AMP) or their modification by [employer] when available. Interpretation is limited to the transcripts indicated on the report and +/- 10 bp into intronic regions, except as noted below. [facility] variant classifications  include pathogenic, likely pathogenic, variant of uncertain significance (VUS), likely benign, and benign. Only variants classified as pathogenic and likely pathogenic are included in the report. All reported variants are confirmed through secondary  manual inspection of DNA sequence data or orthogonal testing. Risk estimations and management guidelines included in this report are based on analysis of primary literature and recommendations of applicable professional societies, and should be regarded  as approximations.Based on validation studies, this assay delivers > 99% sensitivity and specificity for single nucleotide variants and insertions and deletions (indels) up to 20 bp. Larger indels and complex variants are a lso reported but sensitivity  may be reduced. Based on  validation studies, this assay delivers > 99% sensitivity to multi-exon CNVs and > 90% sensitivity to single-exon CNVs. This test may not detect variants in challenging regions (such as short tandem repeats, homopolymer runs, and  segment duplications), sub-exonic CNVs, chromosomal aneuploidy, or variants in the presence of mosaicism. Phasing will be attempted and reported, when possible. Structural rearrangements such as inversions, translocations, and gene conversions are not  tested in this assay unless explicitly indicated. Additionally, deep intronic, promoter, and enhancer regions may not be covered. It is important to note that this is a screening test and cannot detect all disease-causing variants. A negative result does  not guarantee the absence of a rare, undetectable variant in the genes analyzed; consider using a diagnostic  test if there is significant personal and/or family history of one of the conditions analyze d by this test. Any potential incidental findings  outside of these genes and conditions will not be identified, nor reported. The results of a genetic test may be influenced by various factors, including bone marrow transplantation, blood transfusions, or in rare cases, hematolymphoid neoplasms.Gene  Specific Notes:APOB: analysis is limited to c.10580G>A and c.10579C>T; BRCA1: sequencing analysis extends to CDS +/-20 bp; BRCA2: sequencing analysis extends to CDS +/-20 bp. EPCAM: analysis is limited to CNV of exons 8-9; MLH1: analysis includes CNV of  the promoter; PMS2: analysis is limited to exons 1-10.Donnice JINNY Kemp, PhD, [email or web address]     Diabetic Foot Exam:     PHQ2/9:    07/21/2023    2:19 PM 01/27/2023    7:55 AM 07/29/2022    7:58 AM 07/25/2021    8:03 AM 02/22/2021    7:51 AM  Depression screen PHQ 2/9  Decreased Interest 0 0 0 0 0  Down, Depressed, Hopeless 0 0 0 0 0  PHQ - 2 Score 0 0 0 0 0  Altered sleeping 0 0 0 0 0  Tired, decreased energy 0  0 0 0 0  Change in appetite 0 0 0 0 0  Feeling bad or failure about yourself  0 0 0 0 0  Trouble concentrating 0 0 0 0 0  Moving slowly or fidgety/restless 0 0 0 0 0  Suicidal thoughts 0 0 0 0 0  PHQ-9 Score 0 0 0 0 0  Difficult doing work/chores Not difficult at all        phq 9 is negative   Assessment & Plan   1. Depression, major, in remission (HCC) (Primary)  - venlafaxine  XR (EFFEXOR  XR) 75 MG 24 hr capsule; Take 1 capsule (75 mg total) by mouth daily with breakfast.  Dispense: 90 capsule; Refill: 1 - venlafaxine  XR (EFFEXOR  XR) 37.5 MG 24 hr capsule; Take 1 capsule (37.5 mg total) by mouth daily with breakfast.  Dispense: 90 capsule; Refill: 0  2. Migraine without aura, intractable, without status migrainosus  Doing well on Atenolol , no recent episodes   3. Pre-diabetes  Resume a low carbohydrate diet   4. Dyslipidemia  - omega-3 acid ethyl esters (LOVAZA ) 1 g capsule; Take 1 capsule (1 g total) by mouth 2 (two) times daily.  Dispense: 180 capsule; Refill: 1  5. Need for influenza vaccination  - Flu vaccine trivalent PF, 6mos and older(Flulaval,Afluria,Fluarix,Fluzone)  6. Hypertriglyceridemia  - EKG 12-Lead - omega-3 acid ethyl esters (LOVAZA ) 1 g capsule; Take 1 capsule (1 g total) by mouth 2 (two) times daily.  Dispense: 180 capsule; Refill: 1  7. Vitamin D  deficiency  Continue supplements  8. Perennial allergic rhinitis  Worse during Spring takes otc medication  9. GERD without esophagitis  stable  10. Mild intermittent reactive airway disease without complication  Doing well at this time  11. GAD (generalized anxiety disorder)  - venlafaxine  XR (EFFEXOR  XR) 75 MG 24 hr capsule; Take 1 capsule (75 mg total) by mouth daily with breakfast.  Dispense: 90 capsule; Refill: 1 - venlafaxine  XR (EFFEXOR  XR) 37.5 MG 24 hr capsule; Take 1 capsule (37.5 mg total) by mouth daily with breakfast.  Dispense: 90 capsule; Refill: 0

## 2023-08-03 ENCOUNTER — Encounter: Payer: BC Managed Care – PPO | Admitting: Family Medicine

## 2023-09-29 DIAGNOSIS — R92323 Mammographic fibroglandular density, bilateral breasts: Secondary | ICD-10-CM | POA: Diagnosis not present

## 2023-09-29 DIAGNOSIS — Z1231 Encounter for screening mammogram for malignant neoplasm of breast: Secondary | ICD-10-CM | POA: Diagnosis not present

## 2023-09-29 LAB — HM MAMMOGRAPHY

## 2023-10-16 ENCOUNTER — Other Ambulatory Visit: Payer: Self-pay | Admitting: Family Medicine

## 2023-10-16 DIAGNOSIS — F411 Generalized anxiety disorder: Secondary | ICD-10-CM

## 2023-10-16 DIAGNOSIS — F325 Major depressive disorder, single episode, in full remission: Secondary | ICD-10-CM

## 2023-10-22 DIAGNOSIS — Z01419 Encounter for gynecological examination (general) (routine) without abnormal findings: Secondary | ICD-10-CM | POA: Diagnosis not present

## 2023-10-22 DIAGNOSIS — Z6829 Body mass index (BMI) 29.0-29.9, adult: Secondary | ICD-10-CM | POA: Diagnosis not present

## 2023-11-09 ENCOUNTER — Telehealth: Payer: Self-pay | Admitting: Family Medicine

## 2023-11-09 NOTE — Telephone Encounter (Signed)
 Ubrogepant  (UBRELVY ) 100 MG TABS   Key: [id number]

## 2023-11-09 NOTE — Telephone Encounter (Signed)
 Additional Information Required Prior Authorization Not Required  Prescription since 2022.

## 2023-11-13 ENCOUNTER — Encounter: Payer: Self-pay | Admitting: *Deleted

## 2024-01-10 ENCOUNTER — Other Ambulatory Visit: Payer: Self-pay | Admitting: Family Medicine

## 2024-01-10 DIAGNOSIS — F325 Major depressive disorder, single episode, in full remission: Secondary | ICD-10-CM

## 2024-01-10 DIAGNOSIS — G43019 Migraine without aura, intractable, without status migrainosus: Secondary | ICD-10-CM

## 2024-01-10 DIAGNOSIS — F411 Generalized anxiety disorder: Secondary | ICD-10-CM

## 2024-01-10 DIAGNOSIS — E781 Pure hyperglyceridemia: Secondary | ICD-10-CM

## 2024-01-10 DIAGNOSIS — E785 Hyperlipidemia, unspecified: Secondary | ICD-10-CM

## 2024-01-15 ENCOUNTER — Other Ambulatory Visit: Payer: Self-pay | Admitting: Family Medicine

## 2024-01-15 DIAGNOSIS — F325 Major depressive disorder, single episode, in full remission: Secondary | ICD-10-CM

## 2024-01-15 DIAGNOSIS — F411 Generalized anxiety disorder: Secondary | ICD-10-CM

## 2024-01-19 ENCOUNTER — Encounter: Payer: Self-pay | Admitting: Family Medicine

## 2024-01-19 ENCOUNTER — Ambulatory Visit (INDEPENDENT_AMBULATORY_CARE_PROVIDER_SITE_OTHER): Payer: Self-pay | Admitting: Family Medicine

## 2024-01-19 VITALS — BP 124/78 | HR 100 | Resp 16 | Ht 62.0 in | Wt 163.9 lb

## 2024-01-19 DIAGNOSIS — R7303 Prediabetes: Secondary | ICD-10-CM | POA: Insufficient documentation

## 2024-01-19 DIAGNOSIS — K219 Gastro-esophageal reflux disease without esophagitis: Secondary | ICD-10-CM

## 2024-01-19 DIAGNOSIS — F325 Major depressive disorder, single episode, in full remission: Secondary | ICD-10-CM | POA: Diagnosis not present

## 2024-01-19 DIAGNOSIS — F411 Generalized anxiety disorder: Secondary | ICD-10-CM

## 2024-01-19 DIAGNOSIS — G43019 Migraine without aura, intractable, without status migrainosus: Secondary | ICD-10-CM

## 2024-01-19 DIAGNOSIS — Z79899 Other long term (current) drug therapy: Secondary | ICD-10-CM

## 2024-01-19 DIAGNOSIS — J3089 Other allergic rhinitis: Secondary | ICD-10-CM

## 2024-01-19 DIAGNOSIS — Z1211 Encounter for screening for malignant neoplasm of colon: Secondary | ICD-10-CM

## 2024-01-19 DIAGNOSIS — J4520 Mild intermittent asthma, uncomplicated: Secondary | ICD-10-CM

## 2024-01-19 DIAGNOSIS — E785 Hyperlipidemia, unspecified: Secondary | ICD-10-CM

## 2024-01-19 MED ORDER — ATENOLOL 25 MG PO TABS: 25.0000 mg | ORAL_TABLET | Freq: Every day | ORAL | 3 refills | Status: AC

## 2024-01-19 MED ORDER — VENLAFAXINE HCL ER 75 MG PO CP24: 75.0000 mg | ORAL_CAPSULE | Freq: Every day | ORAL | 3 refills | Status: AC

## 2024-01-19 MED ORDER — ALBUTEROL SULFATE HFA 108 (90 BASE) MCG/ACT IN AERS: INHALATION_SPRAY | RESPIRATORY_TRACT | 1 refills | Status: AC

## 2024-01-19 MED ORDER — ROSUVASTATIN CALCIUM 10 MG PO TABS: 10.0000 mg | ORAL_TABLET | Freq: Every day | ORAL | 3 refills | Status: AC

## 2024-01-19 MED ORDER — OMEGA-3-ACID ETHYL ESTERS 1 G PO CAPS: 1.0000 | ORAL_CAPSULE | Freq: Two times a day (BID) | ORAL | 3 refills | Status: AC

## 2024-01-19 NOTE — Progress Notes (Signed)
 Name: Mikayla Fritz   MRN: [id number]    DOB: Nov 17, 1970   Date:01/19/2024       Progress Note  Subjective  Chief Complaint  Chief Complaint  Patient presents with   Medical Management of Chronic Issues   Discussed the use of AI scribe software for clinical note transcription with the patient, who gave verbal consent to proceed.  History of Present Illness Mikayla Fritz is a 53 year old female who presents for a follow-up visit.  She has not experienced any migraines since starting atenolol  in the summer of 2020. She takes a small dose of atenolol  primarily for migraine prevention and uses Ubrelvy  as needed, though she has not needed it recently. Her migraines were previously associated with her menstrual cycles, accompanied by nausea and sensitivity to light and sound.  She continues to have menstrual cycles, although she skipped the month of June in both the previous and current year. No significant menopausal symptoms at this time.  History of major depression and generalized anxiety disorder. Her anxiety was previously managed with venlafaxine , and she successfully reduced her dose from 150 mg to 75 mg by tapering gradually. She feels 'a lot more mellow' and has no issues with anger or sleep disturbances.  History of respiratory issues in childhood but was never formally diagnosed with asthma. Experiences wheezing or coughing during physical activity or in smoky or humid conditions. Uses an albuterol  inhaler as needed, which provides relief.  Occasional reflux, heartburn, and indigestion, managed with over-the-counter omeprazole. History of gastritis and avoids spicy foods to prevent symptoms. No nausea, vomiting, or bloody stools.  Takes Lovaza  for dyslipidemia due to a family history of heart attacks, particularly on her mother's side. Her mother had four heart attacks before age 76. She is proactive in managing her cholesterol levels and has made dietary changes to address her elevated  A1c, which was previously in the prediabetes range. She has reduced her intake of desserts and sweet beverages, primarily drinking water.  Past diagnosis of vulvitis, treated with a prescribed cream, resulting in improvement. Sensitive to various products and cautious with soap to avoid irritation.     Patient Active Problem List   Diagnosis Date Noted   Depression, major, in remission (HCC) 01/27/2023   Reactive airway disease 01/27/2023   Vitamin D  deficiency 01/27/2023   Dyslipidemia 01/27/2023   Osteopenia 04/27/2019   Thrombocytosis 07/30/2018   Migraine without aura, intractable, without status migrainosus 10/08/2017   Low HDL (under 40) 09/18/2016   GERD without esophagitis 06/19/2016   Hypertriglyceridemia 06/19/2016   Recurrent cystitis 06/19/2016   External hemorrhoids 08/05/2007   Perennial allergic rhinitis 08/05/2007   History of gastritis 08/05/2007   GAD (generalized anxiety disorder) 08/05/2007    Past Surgical History:  Procedure Laterality Date   TONSILLECTOMY  2002    Family History  Problem Relation Age of Onset   Heart disease Mother    Cancer Mother        Kidney   Heart attack Mother        3   Arthritis Mother    Thyroid disease Mother    Kidney disease Mother    Hypertension Father    Diabetes Father    Anxiety disorder Brother    Cancer Maternal Grandmother        Breast   Breast cancer Maternal Grandmother     Social History   Tobacco Use   Smoking status: Never   Smokeless tobacco: Never  Substance Use Topics  Alcohol use: No     Current Outpatient Medications:    albuterol  (VENTOLIN  HFA) 108 (90 Base) MCG/ACT inhaler, TAKE 2 PUFFS BY MOUTH EVERY 6 HOURS AS NEEDED FOR WHEEZE OR SHORTNESS OF BREATH, Disp: 8.5 each, Rfl: 11   atenolol  (TENORMIN ) 25 MG tablet, TAKE 1 TABLET (25 MG TOTAL) BY MOUTH DAILY., Disp: 90 tablet, Rfl: 1   omega-3 acid ethyl esters (LOVAZA ) 1 g capsule, Take 1 capsule (1 g total) by mouth 2 (two) times  daily., Disp: 180 capsule, Rfl: 1   rosuvastatin  (CRESTOR ) 10 MG tablet, TAKE 1 TABLET BY MOUTH EVERY DAY, Disp: 90 tablet, Rfl: 1   venlafaxine  XR (EFFEXOR  XR) 75 MG 24 hr capsule, Take 1 capsule (75 mg total) by mouth daily with breakfast., Disp: 90 capsule, Rfl: 1   Ubrogepant  (UBRELVY ) 100 MG TABS, Take 1 tablet by mouth daily as needed. (Patient not taking: Reported on 07/21/2023), Disp: 16 tablet, Rfl: 0   venlafaxine  XR (EFFEXOR -XR) 37.5 MG 24 hr capsule, TAKE 1 CAPSULE BY MOUTH DAILY WITH BREAKFAST., Disp: 90 capsule, Rfl: 0  Allergies  Allergen Reactions   Clindamycin/Lincomycin Itching, Nausea Only and Other (See Comments)    Stomach aches and pain   Codeine     REACTION: makes feel loopy    I personally reviewed active problem list, medication list, allergies with the patient/caregiver today.   ROS  Ten systems reviewed and is negative except as mentioned in HPI    Objective Physical Exam  CONSTITUTIONAL: Patient appears well-developed and well-nourished.  No distress. HEENT: Head atraumatic, normocephalic, neck supple. CARDIOVASCULAR: Normal rate, regular rhythm and normal heart sounds.  No murmur heard. No BLE edema. PULMONARY: Effort normal and breath sounds normal. No respiratory distress. ABDOMINAL: There is no tenderness or distention. MUSCULOSKELETAL: Normal gait. Without gross motor or sensory deficit. PSYCHIATRIC: Patient has a normal mood and affect. behavior is normal. Judgment and thought content normal.  Vitals:   01/19/24 1520  BP: 124/78  Pulse: 100  Resp: 16  SpO2: 97%  Weight: 163 lb 14.4 oz (74.3 kg)  Height: 5' 2 (1.575 m)    Body mass index is 29.98 kg/m.   PHQ2/9:    01/19/2024    3:16 PM 07/21/2023    2:19 PM 01/27/2023    7:55 AM 07/29/2022    7:58 AM 07/25/2021    8:03 AM  Depression screen PHQ 2/9  Decreased Interest 0 0 0 0 0  Down, Depressed, Hopeless 0 0 0 0 0  PHQ - 2 Score 0 0 0 0 0  Altered sleeping 0 0 0 0 0  Tired,  decreased energy 0 0 0 0 0  Change in appetite 0 0 0 0 0  Feeling bad or failure about yourself  0 0 0 0 0  Trouble concentrating 0 0 0 0 0  Moving slowly or fidgety/restless 0 0 0 0 0  Suicidal thoughts 0 0 0 0 0  PHQ-9 Score 0 0 0 0 0  Difficult doing work/chores Not difficult at all Not difficult at all       phq 9 is negative  Fall Risk:    01/19/2024    3:03 PM 07/21/2023    2:06 PM 01/27/2023    7:55 AM 07/29/2022    7:58 AM 07/25/2021    8:03 AM  Fall Risk   Falls in the past year? 0 0 0 0 0  Number falls in past yr: 0 0 0 0 0  Injury with  Fall? 0 0 0 0 0  Risk for fall due to : No Fall Risks No Fall Risks No Fall Risks No Fall Risks No Fall Risks  Follow up Falls evaluation completed Falls prevention discussed;Education provided;Falls evaluation completed Falls prevention discussed Falls prevention discussed  Falls prevention discussed      Data saved with a previous flowsheet row definition     Assessment & Plan Migraine, well controlled with preventive therapy Migraine well controlled with atenolol  since summer 2020. Ubrelvy  available but unused. - Continue atenolol  for migraine prevention. - Maintain Ubrelvy  for as-needed use.  Generalized anxiety disorder and major depressive disorder in remission Anxiety and depression in remission. Venlafaxine  tapered to 75 mg with improved anger issues. - Continue venlafaxine  75 mg daily. - Reassess dosage in six months or after menopause.  Reactive airway disease, intermittent Intermittent symptoms relieved with albuterol  inhaler. - Prescribe albuterol  inhaler with refills.  Dyslipidemia Managed with rosuvastatin  and Lovaza . Family history of heart attacks. Previous labs showed low HDL and elevated triglycerides. - Continue rosuvastatin  and Lovaza . - Order fasting lipid panel.  Prediabetes Previous A1c in prediabetes range. Dietary modifications made. - Order fasting glucose and A1c.  Gastroesophageal reflux disease and  history of gastritis Occasional symptoms managed with OTC omeprazole. History of gastritis with dietary modifications.  Fatty liver, history of History of fatty liver on imaging. Liver enzymes to be monitored. - Discuss fatty liver management with GI specialist during referral.  Overweight BMI indicates overweight. Lifestyle modifications recommended.

## 2024-02-04 DIAGNOSIS — E785 Hyperlipidemia, unspecified: Secondary | ICD-10-CM | POA: Diagnosis not present

## 2024-02-04 DIAGNOSIS — Z79899 Other long term (current) drug therapy: Secondary | ICD-10-CM | POA: Diagnosis not present

## 2024-02-04 DIAGNOSIS — R7303 Prediabetes: Secondary | ICD-10-CM | POA: Diagnosis not present

## 2024-02-04 LAB — CBC WITH DIFFERENTIAL/PLATELET
Absolute Lymphocytes: 3428 {cells}/uL (ref 850–3900)
Absolute Monocytes: 432 {cells}/uL (ref 200–950)
Basophils Absolute: 33 {cells}/uL (ref 0–200)
Basophils Relative: 0.4 %
Eosinophils Absolute: 91 {cells}/uL (ref 15–500)
Eosinophils Relative: 1.1 %
HCT: 40.9 % (ref 35.0–45.0)
Hemoglobin: 13.1 g/dL (ref 11.7–15.5)
MCH: 29.6 pg (ref 27.0–33.0)
MCHC: 32 g/dL (ref 32.0–36.0)
MCV: 92.5 fL (ref 80.0–100.0)
MPV: 9.6 fL (ref 7.5–12.5)
Monocytes Relative: 5.2 %
Neutro Abs: 4316 {cells}/uL (ref 1500–7800)
Neutrophils Relative %: 52 %
Platelets: 364 Thousand/uL (ref 140–400)
RBC: 4.42 Million/uL (ref 3.80–5.10)
RDW: 12.5 % (ref 11.0–15.0)
Total Lymphocyte: 41.3 %
WBC: 8.3 Thousand/uL (ref 3.8–10.8)

## 2024-02-04 LAB — COMPREHENSIVE METABOLIC PANEL WITH GFR
AG Ratio: 2.1 (calc) (ref 1.0–2.5)
ALT: 39 U/L — ABNORMAL HIGH (ref 6–29)
AST: 28 U/L (ref 10–35)
Albumin: 4.6 g/dL (ref 3.6–5.1)
Alkaline phosphatase (APISO): 74 U/L (ref 37–153)
BUN: 12 mg/dL (ref 7–25)
CO2: 23 mmol/L (ref 20–32)
Calcium: 9.3 mg/dL (ref 8.6–10.4)
Chloride: 105 mmol/L (ref 98–110)
Creat: 0.62 mg/dL (ref 0.50–1.03)
Globulin: 2.2 g/dL (ref 1.9–3.7)
Glucose, Bld: 124 mg/dL — ABNORMAL HIGH (ref 65–99)
Potassium: 4.2 mmol/L (ref 3.5–5.3)
Sodium: 140 mmol/L (ref 135–146)
Total Bilirubin: 0.5 mg/dL (ref 0.2–1.2)
Total Protein: 6.8 g/dL (ref 6.1–8.1)
eGFR: 106 mL/min/1.73m2 (ref >=60)

## 2024-02-04 LAB — LIPID PANEL
Cholesterol: 125 mg/dL (ref <200)
HDL: 36 mg/dL — ABNORMAL LOW (ref >=50)
LDL Cholesterol (Calc): 59 mg/dL
Non-HDL Cholesterol (Calc): 89 mg/dL (ref <130)
Total CHOL/HDL Ratio: 3.5 (calc) (ref <5.0)
Triglycerides: 244 mg/dL — ABNORMAL HIGH (ref <150)

## 2024-02-04 LAB — HEMOGLOBIN A1C
Hgb A1c MFr Bld: 5.9 % — ABNORMAL HIGH (ref <5.7)
Mean Plasma Glucose: 123 mg/dL
eAG (mmol/L): 6.8 mmol/L

## 2024-02-09 ENCOUNTER — Ambulatory Visit: Payer: Self-pay | Admitting: Family Medicine

## 2024-03-15 ENCOUNTER — Encounter: Payer: Self-pay | Admitting: Family Medicine

## 2024-03-15 ENCOUNTER — Ambulatory Visit
Admission: RE | Admit: 2024-03-15 | Discharge: 2024-03-15 | Disposition: A | Discharge status: Home / Self Care | Attending: Family Medicine | Admitting: Family Medicine

## 2024-03-15 VITALS — BP 114/75 | HR 100 | Temp 98.1°F | Resp 18 | Ht 62.0 in | Wt 160.0 lb

## 2024-03-15 DIAGNOSIS — R197 Diarrhea, unspecified: Secondary | ICD-10-CM | POA: Diagnosis not present

## 2024-03-15 NOTE — ED Notes (Signed)
 Patient is being discharged from the Urgent Care and sent to the Emergency Department via private vehicle . Per Burnard Cork, NP patient is in need of higher level of care due to further evaluation. Patient is aware and verbalizes understanding of plan of care.  Vitals:   03/15/24 1158  BP: 114/75  Pulse: 100  Resp: 18  Temp: 98.1 F (36.7 C)  SpO2: 95%

## 2024-03-15 NOTE — Discharge Instructions (Signed)
 Go to the emergency department for evaluation of your persistent worsening diarrhea for 11 days.

## 2024-03-15 NOTE — ED Triage Notes (Signed)
 Patient c/o diarrhea x 11 days, changed her diet to try and r/o the cause of the diarrhea, slight nausea.  Patient has tried Omeprazole, Pepto, Immodium, afebrile.  Patient is due for her colonoscopy.  No recent travel.

## 2024-03-15 NOTE — ED Provider Notes (Signed)
 CAY RALPH PELT    CSN: [id number] Arrival date & time: 03/15/24  1151      History   Chief Complaint Chief Complaint  Patient presents with   Diarrhea    I have had diarrhea for 11 days - Entered by patient    HPI Mikayla Fritz is a 53 y.o. female.  Patient presents with 11-day history of persistent worsening diarrhea.  She reports more than 10 episodes of diarrhea daily which is increasing in frequency.  She reports diarrhea every 20 to 30 minutes in the last 24 hours.  No fever, bloody stool, mucous in stool.  No recent travel out of the country or antibiotic use.  No fever, chills, abdominal pain, nausea, vomiting, dysuria, hematuria.  Treatment attempted with Pepto-Bismol, Imodium, omeprazole.  Patient is concerned for possible exposure to Listeria as she has been eating blueberries.  Her medical history includes GERD, gastritis, hemorrhoids.  The history is provided by the patient and medical records.    Past Medical History:  Diagnosis Date   Allergy    Anxiety    Dysplastic nevus 11/07/2019   R inf scapula - moderate   Dysplastic nevus 11/07/2019   R sup buttocks crease - moderate   Dysplastic nevus 11/07/2019   L med to sup med buttock - moderate   Dysplastic nevus 11/07/2019   L med thigh - moderate   GERD (gastroesophageal reflux disease)    occasionally     Patient Active Problem List   Diagnosis Date Noted   Pre-diabetes 01/19/2024   Depression, major, in remission (HCC) 01/27/2023   Reactive airway disease 01/27/2023   Vitamin D  deficiency 01/27/2023   Dyslipidemia 01/27/2023   Osteopenia 04/27/2019   Thrombocytosis 07/30/2018   Migraine without aura, intractable, without status migrainosus 10/08/2017   Low HDL (under 40) 09/18/2016   GERD without esophagitis 06/19/2016   Hypertriglyceridemia 06/19/2016   Recurrent cystitis 06/19/2016   External hemorrhoids 08/05/2007   Perennial allergic rhinitis 08/05/2007   History of gastritis 08/05/2007    GAD (generalized anxiety disorder) 08/05/2007    Past Surgical History:  Procedure Laterality Date   TONSILLECTOMY  2002    OB History   No obstetric history on file.      Home Medications    Prior to Admission medications   Medication Sig Start Date End Date Taking? Authorizing Provider  albuterol  (VENTOLIN  HFA) 108 (90 Base) MCG/ACT inhaler TAKE 2 PUFFS BY MOUTH EVERY 6 HOURS AS NEEDED FOR WHEEZE OR SHORTNESS OF BREATH 01/19/24  Yes Sowles, Krichna, MD  atenolol  (TENORMIN ) 25 MG tablet Take 1 tablet (25 mg total) by mouth daily. 01/19/24  Yes Sowles, Krichna, MD  omega-3 acid ethyl esters (LOVAZA ) 1 g capsule Take 1 capsule (1 g total) by mouth 2 (two) times daily. 01/19/24  Yes Sowles, Krichna, MD  rosuvastatin  (CRESTOR ) 10 MG tablet Take 1 tablet (10 mg total) by mouth daily. 01/19/24  Yes Sowles, Krichna, MD  venlafaxine  XR (EFFEXOR  XR) 75 MG 24 hr capsule Take 1 capsule (75 mg total) by mouth daily with breakfast. 01/19/24  Yes Sowles, Krichna, MD  Ubrogepant  (UBRELVY ) 100 MG TABS Take 1 tablet by mouth daily as needed. Patient not taking: Reported on 07/21/2023 09/20/20   Sowles, Krichna, MD    Family History Family History  Problem Relation Age of Onset   Heart disease Mother    Cancer Mother        Kidney   Heart attack Mother  3   Arthritis Mother    Thyroid disease Mother    Kidney disease Mother    Hypertension Father    Diabetes Father    Anxiety disorder Brother    Cancer Maternal Grandmother        Breast   Breast cancer Maternal Grandmother     Social History Social History   Tobacco Use   Smoking status: Never   Smokeless tobacco: Never  Vaping Use   Vaping status: Never Used  Substance Use Topics   Alcohol use: No   Drug use: No     Allergies   Clindamycin/lincomycin and Codeine   Review of Systems Review of Systems  Constitutional:  Negative for chills and fever.  Gastrointestinal:  Positive for diarrhea. Negative for abdominal pain,  blood in stool, constipation, nausea and vomiting.  Genitourinary:  Negative for dysuria and hematuria.     Physical Exam Triage Vital Signs ED Triage Vitals  Encounter Vitals Group     BP      Girls Systolic BP Percentile      Girls Diastolic BP Percentile      Boys Systolic BP Percentile      Boys Diastolic BP Percentile      Pulse      Resp      Temp      Temp src      SpO2      Weight      Height      Head Circumference      Peak Flow      Pain Score      Pain Loc      Pain Education      Exclude from Growth Chart    No data found.  Updated Vital Signs BP 114/75 (BP Location: Left Arm)   Pulse 100   Temp 98.1 F (36.7 C) (Oral)   Resp 18   Ht 5' 2 (1.575 m)   Wt 160 lb (72.6 kg)   LMP 02/19/2024   SpO2 95%   BMI 29.26 kg/m   Visual Acuity Right Eye Distance:   Left Eye Distance:   Bilateral Distance:    Right Eye Near:   Left Eye Near:    Bilateral Near:     Physical Exam Constitutional:      General: She is not in acute distress. HENT:     Mouth/Throat:     Mouth: Mucous membranes are moist.  Cardiovascular:     Rate and Rhythm: Normal rate and regular rhythm.     Heart sounds: Normal heart sounds.  Pulmonary:     Effort: Pulmonary effort is normal. No respiratory distress.     Breath sounds: Normal breath sounds.  Abdominal:     General: Bowel sounds are normal.     Palpations: Abdomen is soft.     Tenderness: There is no abdominal tenderness. There is no right CVA tenderness, left CVA tenderness, guarding or rebound.  Neurological:     Mental Status: She is alert.      UC Treatments / Results  Labs (all labs ordered are listed, but only abnormal results are displayed) Labs Reviewed - No data to display  EKG   Radiology No results found.  Procedures Procedures (including critical care time)  Medications Ordered in UC Medications - No data to display  Initial Impression / Assessment and Plan / UC Course  I have reviewed  the triage vital signs and the nursing notes.  Pertinent labs & imaging  results that were available during my care of the patient were reviewed by me and considered in my medical decision making (see chart for details).    Intractable diarrhea.  Afebrile and vital signs are stable.  Abdomen is soft and nontender.  Patient is concerned for possible exposure to Listeria.  She is also concerned for gallbladder issue.  Patient declines giving a stool specimen here as she is primarily concerned for these 2 issues.  Limitations of evaluation in an urgent care setting discussed.  Patient would prefer to be evaluated in the ED.  She will go to [facility] ED now.  Final Clinical Impressions(s) / UC Diagnoses   Final diagnoses:  Intractable diarrhea     Discharge Instructions      Go to the emergency department for evaluation of your persistent worsening diarrhea for 11 days.     ED Prescriptions   None    PDMP not reviewed this encounter.   Corlis Burnard DEL, NP 03/15/24 1226

## 2024-03-17 ENCOUNTER — Ambulatory Visit (INDEPENDENT_AMBULATORY_CARE_PROVIDER_SITE_OTHER): Admitting: Family Medicine

## 2024-03-17 ENCOUNTER — Encounter: Payer: Self-pay | Admitting: Family Medicine

## 2024-03-17 VITALS — BP 120/68 | HR 95 | Resp 16 | Ht 62.0 in | Wt 160.2 lb

## 2024-03-17 DIAGNOSIS — R197 Diarrhea, unspecified: Secondary | ICD-10-CM

## 2024-03-17 MED ORDER — AZITHROMYCIN 500 MG PO TABS
1000.0000 mg | ORAL_TABLET | Freq: Every day | ORAL | 0 refills | Status: DC
Start: 2024-03-17 — End: 2024-03-22

## 2024-03-17 NOTE — Progress Notes (Signed)
 Name: Mikayla Fritz   MRN: [id number]    DOB: 1970-10-10   Date:03/17/2024       Progress Note  Subjective  Chief Complaint  Chief Complaint  Patient presents with   Diarrhea    X2 weeks, took imodium 2 days ago and it has stopped    Discussed the use of AI scribe software for clinical note transcription with the patient, who gave verbal consent to proceed.  History of Present Illness Mikayla Fritz is a 53 year old female who presents with two weeks of persistent diarrhea.  For the past two weeks, she has experienced sudden onset of watery diarrhea with significant urgency, leading to episodes of incontinence. She reports up to twenty substantial bowel movements in a single morning.  She has tried over-the-counter treatments including Tums, omeprazole, activated charcoal, and Pepto-Bismol, with minimal relief. Pepto-Bismol provided temporary relief for about an hour or two. Her mother suggested merrem, which she took three times the day before yesterday and once yesterday; after this, she noted her diarrhea stopped. She did not take any today to observe her symptoms and had a small bowel movement this morning. Currently, she feels her stomach is 'rumbling' and she feels nauseous.  No blood or mucus has been observed in her stools, although she mentions seeing 'floaters'. No cramping, fever, or recent antibiotic use. She has a family history of gut issues and a personal history of GERD, previously treated with Prilosec and Prevacid. She avoids spicy and fried foods due to her sensitive stomach.  She maintains hydration and has not noticed any illness in her family members. She drinks filtered city water and has not traveled recently. She consumes blueberries daily, which she rinses before eating.  She feels weak and almost faint, attributing it to possibly low blood sugar, but she is able to eat normally. She missed work on Tuesday due to the severity of her symptoms but has been working since  then. She drove herself to the appointment and felt fine until she arrived, where she began feeling unwell.    Patient Active Problem List   Diagnosis Date Noted   Pre-diabetes 01/19/2024   Depression, major, in remission (HCC) 01/27/2023   Reactive airway disease 01/27/2023   Vitamin D  deficiency 01/27/2023   Dyslipidemia 01/27/2023   Osteopenia 04/27/2019   Thrombocytosis 07/30/2018   Migraine without aura, intractable, without status migrainosus 10/08/2017   Low HDL (under 40) 09/18/2016   GERD without esophagitis 06/19/2016   Hypertriglyceridemia 06/19/2016   Recurrent cystitis 06/19/2016   External hemorrhoids 08/05/2007   Perennial allergic rhinitis 08/05/2007   History of gastritis 08/05/2007   GAD (generalized anxiety disorder) 08/05/2007    Social History   Tobacco Use   Smoking status: Never   Smokeless tobacco: Never  Substance Use Topics   Alcohol use: No     Current Outpatient Medications:    azithromycin  (ZITHROMAX ) 500 MG tablet, Take 2 tablets (1,000 mg total) by mouth daily., Disp: 2 tablet, Rfl: 0   albuterol  (VENTOLIN  HFA) 108 (90 Base) MCG/ACT inhaler, TAKE 2 PUFFS BY MOUTH EVERY 6 HOURS AS NEEDED FOR WHEEZE OR SHORTNESS OF BREATH, Disp: 8.5 each, Rfl: 1   atenolol  (TENORMIN ) 25 MG tablet, Take 1 tablet (25 mg total) by mouth daily., Disp: 90 tablet, Rfl: 3   omega-3 acid ethyl esters (LOVAZA ) 1 g capsule, Take 1 capsule (1 g total) by mouth 2 (two) times daily., Disp: 180 capsule, Rfl: 3   rosuvastatin  (CRESTOR ) 10 MG tablet, Take  1 tablet (10 mg total) by mouth daily., Disp: 90 tablet, Rfl: 3   Ubrogepant  (UBRELVY ) 100 MG TABS, Take 1 tablet by mouth daily as needed. (Patient not taking: Reported on 07/21/2023), Disp: 16 tablet, Rfl: 0   venlafaxine  XR (EFFEXOR  XR) 75 MG 24 hr capsule, Take 1 capsule (75 mg total) by mouth daily with breakfast., Disp: 90 capsule, Rfl: 3  Allergies  Allergen Reactions   Clindamycin/Lincomycin Itching, Nausea Only and  Other (See Comments)    Stomach aches and pain   Codeine     REACTION: makes feel loopy    ROS  Ten systems reviewed and is negative except as mentioned in HPI    Objective  Vitals:   03/17/24 0950  BP: 120/68  Pulse: 95  Resp: 16  SpO2: 96%  Weight: 160 lb 3.2 oz (72.7 kg)  Height: 5' 2 (1.575 m)    Body mass index is 29.3 kg/m.  Physical Exam CONSTITUTIONAL: Patient appears well-developed and well-nourished. No distress. Does not appear sickly. HEENT: Head atraumatic, normocephalic, neck supple. CARDIOVASCULAR: Normal rate, regular rhythm and normal heart sounds. No murmur heard. No BLE edema. PULMONARY: Effort normal and breath sounds normal. No respiratory distress. ABDOMINAL: There is no tenderness or distention. Mild increase in bowel sounds  MUSCULOSKELETAL: Normal gait. Without gross motor or sensory deficit. PSYCHIATRIC: Patient has a normal mood and affect. Behavior is normal. Judgment and thought content normal.  Recent Results (from the past 2160 hours)  Lipid panel     Status: Abnormal   Collection Time: 02/04/24  8:14 AM  Result Value Ref Range   Cholesterol 125 <200 mg/dL   HDL 36 (L) > OR = 50 mg/dL   Triglycerides 755 (H) <150 mg/dL    Comment: . If a non-fasting specimen was collected, consider repeat triglyceride testing on a fasting specimen if clinically indicated.  Veatrice et al. J. of Clin. Lipidol. 2015;9:129-169. SABRA    LDL Cholesterol (Calc) 59 mg/dL (calc)    Comment: Reference range: <100 . Desirable range <100 mg/dL for primary prevention;   <70 mg/dL for patients with CHD or diabetic patients  with > or = 2 CHD risk factors. SABRA LDL-C is now calculated using the Martin-Hopkins  calculation, which is a validated novel method providing  better accuracy than the Friedewald equation in the  estimation of LDL-C.  Gladis APPLETHWAITE et al. SANDREA. 7986;689(80): 2061-2068  (http://education.[facility].com/faq/FAQ164)    Total CHOL/HDL  Ratio 3.5 <5.0 (calc)   Non-HDL Cholesterol (Calc) 89 <869 mg/dL (calc)    Comment: For patients with diabetes plus 1 major ASCVD risk  factor, treating to a non-HDL-C goal of <100 mg/dL  (LDL-C of <29 mg/dL) is considered a therapeutic  option.   CBC with Differential/Platelet     Status: None   Collection Time: 02/04/24  8:14 AM  Result Value Ref Range   WBC 8.3 3.8 - 10.8 Thousand/uL   RBC 4.42 3.80 - 5.10 Million/uL   Hemoglobin 13.1 11.7 - 15.5 g/dL   HCT 59.0 64.9 - 54.9 %   MCV 92.5 80.0 - 100.0 fL   MCH 29.6 27.0 - 33.0 pg   MCHC 32.0 32.0 - 36.0 g/dL    Comment: For adults, a slight decrease in the calculated MCHC value (in the range of 30 to 32 g/dL) is most likely not clinically significant; however, it should be interpreted with caution in correlation with other red cell parameters and the patient's clinical condition.    RDW 12.5 11.0 -  15.0 %   Platelets 364 140 - 400 Thousand/uL   MPV 9.6 7.5 - 12.5 fL   Neutro Abs 4,316 1,500 - 7,800 cells/uL   Absolute Lymphocytes 3,428 850 - 3,900 cells/uL   Absolute Monocytes 432 200 - 950 cells/uL   Eosinophils Absolute 91 15 - 500 cells/uL   Basophils Absolute 33 0 - 200 cells/uL   Neutrophils Relative % 52 %   Total Lymphocyte 41.3 %   Monocytes Relative 5.2 %   Eosinophils Relative 1.1 %   Basophils Relative 0.4 %  Comprehensive metabolic panel with GFR     Status: Abnormal   Collection Time: 02/04/24  8:14 AM  Result Value Ref Range   Glucose, Bld 124 (H) 65 - 99 mg/dL    Comment: .            Fasting reference interval . For someone without known diabetes, a glucose value between 100 and 125 mg/dL is consistent with prediabetes and should be confirmed with a follow-up test. .    BUN 12 7 - 25 mg/dL   Creat 9.37 9.49 - 8.96 mg/dL   eGFR 893 > OR = 60 fO/fpw/8.26f7   BUN/Creatinine Ratio SEE NOTE: 6 - 22 (calc)    Comment:    Not Reported: BUN and Creatinine are within    reference range. .    Sodium  140 135 - 146 mmol/L   Potassium 4.2 3.5 - 5.3 mmol/L   Chloride 105 98 - 110 mmol/L   CO2 23 20 - 32 mmol/L   Calcium  9.3 8.6 - 10.4 mg/dL   Total Protein 6.8 6.1 - 8.1 g/dL   Albumin 4.6 3.6 - 5.1 g/dL   Globulin 2.2 1.9 - 3.7 g/dL (calc)   AG Ratio 2.1 1.0 - 2.5 (calc)   Total Bilirubin 0.5 0.2 - 1.2 mg/dL   Alkaline phosphatase (APISO) 74 37 - 153 U/L   AST 28 10 - 35 U/L   ALT 39 (H) 6 - 29 U/L  Hemoglobin A1c     Status: Abnormal   Collection Time: 02/04/24  8:14 AM  Result Value Ref Range   Hgb A1c MFr Bld 5.9 (H) <5.7 %    Comment: For someone without known diabetes, a hemoglobin  A1c value between 5.7% and 6.4% is consistent with prediabetes and should be confirmed with a  follow-up test. . For someone with known diabetes, a value <7% indicates that their diabetes is well controlled. A1c targets should be individualized based on duration of diabetes, age, comorbid conditions, and other considerations. . This assay result is consistent with an increased risk of diabetes. . Currently, no consensus exists regarding use of hemoglobin A1c for diagnosis of diabetes for children. .    Mean Plasma Glucose 123 mg/dL   eAG (mmol/L) 6.8 mmol/L    Assessment & Plan Acute diarrhea Acute diarrhea for two weeks with watery stools, urgency, and frequency. Differential includes infectious diarrhea, inflammatory bowel disease, and colitis. Dehydration is a concern. - Order stool PCR to identify infectious agents. - Prescribe azithromycin  1000 mg as a single dose after stool collection. - Ensure adequate hydration. - Order blood tests : CBC and CMP - Consider gastroenterology referral if symptoms persist or worsen.

## 2024-03-18 ENCOUNTER — Ambulatory Visit: Payer: Self-pay | Admitting: Family Medicine

## 2024-03-18 ENCOUNTER — Encounter: Payer: Self-pay | Admitting: Family Medicine

## 2024-03-18 DIAGNOSIS — R197 Diarrhea, unspecified: Secondary | ICD-10-CM | POA: Diagnosis not present

## 2024-03-18 LAB — COMPREHENSIVE METABOLIC PANEL WITH GFR
ALT: 47 IU/L — ABNORMAL HIGH (ref 0–32)
AST: 41 IU/L — ABNORMAL HIGH (ref 0–40)
Albumin: 5 g/dL — ABNORMAL HIGH (ref 3.8–4.9)
Alkaline Phosphatase: 88 IU/L (ref 44–121)
BUN/Creatinine Ratio: 16 (ref 9–23)
BUN: 11 mg/dL (ref 6–24)
Bilirubin Total: 0.4 mg/dL (ref 0.0–1.2)
CO2: 18 mmol/L — ABNORMAL LOW (ref 20–29)
Calcium: 9.9 mg/dL (ref 8.7–10.2)
Chloride: 103 mmol/L (ref 96–106)
Creatinine, Ser: 0.68 mg/dL (ref 0.57–1.00)
Globulin, Total: 2.6 g/dL (ref 1.5–4.5)
Glucose: 99 mg/dL (ref 70–99)
Potassium: 4.5 mmol/L (ref 3.5–5.2)
Sodium: 143 mmol/L (ref 134–144)
Total Protein: 7.6 g/dL (ref 6.0–8.5)
eGFR: 104 mL/min/1.73 (ref >59)

## 2024-03-18 LAB — CBC WITH DIFFERENTIAL/PLATELET
Basophils Absolute: 0.1 x10E3/uL (ref 0.0–0.2)
Basos: 1 %
EOS (ABSOLUTE): 0.3 x10E3/uL (ref 0.0–0.4)
Eos: 3 %
Hematocrit: 41 % (ref 34.0–46.6)
Hemoglobin: 13.7 g/dL (ref 11.1–15.9)
Immature Grans (Abs): 0 x10E3/uL (ref 0.0–0.1)
Immature Granulocytes: 0 %
Lymphocytes Absolute: 3.3 x10E3/uL — ABNORMAL HIGH (ref 0.7–3.1)
Lymphs: 35 %
MCH: 30.4 pg (ref 26.6–33.0)
MCHC: 33.4 g/dL (ref 31.5–35.7)
MCV: 91 fL (ref 79–97)
Monocytes Absolute: 0.6 x10E3/uL (ref 0.1–0.9)
Monocytes: 6 %
Neutrophils Absolute: 5.2 x10E3/uL (ref 1.4–7.0)
Neutrophils: 55 %
Platelets: 338 x10E3/uL (ref 150–450)
RBC: 4.51 x10E6/uL (ref 3.77–5.28)
RDW: 12.1 % (ref 11.7–15.4)
WBC: 9.5 x10E3/uL (ref 3.4–10.8)

## 2024-03-20 ENCOUNTER — Encounter: Payer: Self-pay | Admitting: Family Medicine

## 2024-03-21 ENCOUNTER — Encounter: Payer: Self-pay | Admitting: Family Medicine

## 2024-03-21 ENCOUNTER — Other Ambulatory Visit: Payer: Self-pay | Admitting: Family Medicine

## 2024-03-21 DIAGNOSIS — R197 Diarrhea, unspecified: Secondary | ICD-10-CM

## 2024-03-21 LAB — GI PROFILE, STOOL, PCR

## 2024-03-22 ENCOUNTER — Encounter: Payer: Self-pay | Admitting: Gastroenterology

## 2024-03-22 DIAGNOSIS — R197 Diarrhea, unspecified: Secondary | ICD-10-CM | POA: Diagnosis not present

## 2024-03-22 DIAGNOSIS — R7989 Other specified abnormal findings of blood chemistry: Secondary | ICD-10-CM | POA: Diagnosis not present

## 2024-03-23 ENCOUNTER — Other Ambulatory Visit: Payer: Self-pay | Admitting: Gastroenterology

## 2024-03-23 ENCOUNTER — Encounter: Payer: Self-pay | Admitting: Gastroenterology

## 2024-03-23 DIAGNOSIS — R7989 Other specified abnormal findings of blood chemistry: Secondary | ICD-10-CM

## 2024-03-23 NOTE — Anesthesia Preprocedure Evaluation (Addendum)
 Anesthesia Evaluation  Patient identified by MRN, date of birth, ID band Patient awake    Reviewed: Allergy & Precautions, H&P , NPO status , Patient's Chart, lab work & pertinent test results  Airway Mallampati: III  TM Distance: <3 FB Neck ROM: Full    Dental no notable dental hx. (+) Caps All upper teeth are caps, crowns or veneers:   Pulmonary neg pulmonary ROS, asthma    Pulmonary exam normal breath sounds clear to auscultation       Cardiovascular hypertension, negative cardio ROS Normal cardiovascular exam Rhythm:Regular Rate:Normal     Neuro/Psych  Headaches PSYCHIATRIC DISORDERS Anxiety Depression    negative neurological ROS  negative psych ROS   GI/Hepatic negative GI ROS, Neg liver ROS,GERD  ,,  Endo/Other  negative endocrine ROS    Renal/GU negative Renal ROS  negative genitourinary   Musculoskeletal negative musculoskeletal ROS (+)    Abdominal   Peds negative pediatric ROS (+)  Hematology negative hematology ROS (+)   Anesthesia Other Findings GERD (gastroesophageal reflux disease) Dysplastic nevus Anxiety Allergy Hypertension Pre-diabetes  Headache Depression  Fatty liver    Reproductive/Obstetrics negative OB ROS                              Anesthesia Physical Anesthesia Plan  ASA: 2  Anesthesia Plan: General   Post-op Pain Management:    Induction: Intravenous  PONV Risk Score and Plan:   Airway Management Planned: Natural Airway and Nasal Cannula  Additional Equipment:   Intra-op Plan:   Post-operative Plan:   Informed Consent: I have reviewed the patients History and Physical, chart, labs and discussed the procedure including the risks, benefits and alternatives for the proposed anesthesia with the patient or authorized representative who has indicated his/her understanding and acceptance.     Dental Advisory Given  Plan Discussed with:  Anesthesiologist, CRNA and Surgeon  Anesthesia Plan Comments: (Patient consented for risks of anesthesia including but not limited to:  - adverse reactions to medications - risk of airway placement if required - damage to eyes, teeth, lips or other oral mucosa - nerve damage due to positioning  - sore throat or hoarseness - Damage to heart, brain, nerves, lungs, other parts of body or loss of life  Patient voiced understanding and assent.)         Anesthesia Quick Evaluation

## 2024-03-28 ENCOUNTER — Ambulatory Visit: Payer: Self-pay | Admitting: Anesthesiology

## 2024-03-28 ENCOUNTER — Encounter: Payer: Self-pay | Admitting: Gastroenterology

## 2024-03-28 ENCOUNTER — Encounter
Admission: RE | Disposition: A | Discharge status: Home / Self Care | Payer: Self-pay | Source: Home / Self Care | Attending: Gastroenterology

## 2024-03-28 ENCOUNTER — Ambulatory Visit
Admission: RE | Admit: 2024-03-28 | Discharge: 2024-03-28 | Disposition: A | Discharge status: Home / Self Care | Attending: Gastroenterology | Admitting: Gastroenterology

## 2024-03-28 ENCOUNTER — Other Ambulatory Visit: Payer: Self-pay

## 2024-03-28 DIAGNOSIS — J4520 Mild intermittent asthma, uncomplicated: Secondary | ICD-10-CM | POA: Diagnosis not present

## 2024-03-28 DIAGNOSIS — K6389 Other specified diseases of intestine: Secondary | ICD-10-CM | POA: Diagnosis not present

## 2024-03-28 DIAGNOSIS — K2100 Gastro-esophageal reflux disease with esophagitis, without bleeding: Secondary | ICD-10-CM | POA: Diagnosis not present

## 2024-03-28 DIAGNOSIS — K3189 Other diseases of stomach and duodenum: Secondary | ICD-10-CM | POA: Diagnosis not present

## 2024-03-28 DIAGNOSIS — F419 Anxiety disorder, unspecified: Secondary | ICD-10-CM | POA: Diagnosis not present

## 2024-03-28 DIAGNOSIS — R8589 Other abnormal findings in specimens from digestive organs and abdominal cavity: Secondary | ICD-10-CM | POA: Insufficient documentation

## 2024-03-28 DIAGNOSIS — R197 Diarrhea, unspecified: Secondary | ICD-10-CM | POA: Insufficient documentation

## 2024-03-28 DIAGNOSIS — I10 Essential (primary) hypertension: Secondary | ICD-10-CM | POA: Insufficient documentation

## 2024-03-28 DIAGNOSIS — K2950 Unspecified chronic gastritis without bleeding: Secondary | ICD-10-CM | POA: Insufficient documentation

## 2024-03-28 DIAGNOSIS — F32A Depression, unspecified: Secondary | ICD-10-CM | POA: Diagnosis not present

## 2024-03-28 DIAGNOSIS — K209 Esophagitis, unspecified without bleeding: Secondary | ICD-10-CM | POA: Diagnosis not present

## 2024-03-28 HISTORY — PX: ESOPHAGOGASTRODUODENOSCOPY: SHX5428

## 2024-03-28 HISTORY — DX: Headache, unspecified: R51.9

## 2024-03-28 HISTORY — DX: Migraine, unspecified, not intractable, without status migrainosus: G43.909

## 2024-03-28 HISTORY — PX: COLONOSCOPY: SHX5424

## 2024-03-28 HISTORY — DX: Generalized anxiety disorder: F41.1

## 2024-03-28 HISTORY — DX: Mild intermittent asthma, uncomplicated: J45.20

## 2024-03-28 HISTORY — DX: Depression, unspecified: F32.A

## 2024-03-28 HISTORY — DX: Essential (primary) hypertension: I10

## 2024-03-28 HISTORY — DX: Fatty (change of) liver, not elsewhere classified: K76.0

## 2024-03-28 HISTORY — DX: Prediabetes: R73.03

## 2024-03-28 LAB — POCT PREGNANCY, URINE: Preg Test, Ur: NEGATIVE

## 2024-03-28 SURGERY — COLONOSCOPY
Anesthesia: General | Site: Rectum

## 2024-03-28 MED ORDER — LIDOCAINE 2% (20 MG/ML) 5 ML SYRINGE
INTRAMUSCULAR | Status: DC | PRN
  Administered 2024-03-28: 100 mg via INTRAVENOUS

## 2024-03-28 MED ORDER — OMEPRAZOLE 40 MG PO CPDR: 40.0000 mg | DELAYED_RELEASE_CAPSULE | Freq: Every day | ORAL | 2 refills | Status: AC

## 2024-03-28 MED ORDER — LACTATED RINGERS IV SOLN
INTRAVENOUS | Status: DC
Start: 2024-03-28 — End: 2024-03-28

## 2024-03-28 MED ORDER — PROPOFOL 10 MG/ML IV BOLUS
INTRAVENOUS | Status: AC
  Filled 2024-03-28: qty 20

## 2024-03-28 MED ORDER — STERILE WATER FOR IRRIGATION IR SOLN
Status: DC | PRN
  Administered 2024-03-28: 1

## 2024-03-28 MED ORDER — SODIUM CHLORIDE 0.9 % IV SOLN: INTRAVENOUS | Status: DC

## 2024-03-28 MED ORDER — PROPOFOL 10 MG/ML IV BOLUS
INTRAVENOUS | Status: DC | PRN
  Administered 2024-03-28 (×2): 40 mg via INTRAVENOUS
  Administered 2024-03-28 (×4): 50 mg via INTRAVENOUS
  Administered 2024-03-28: 100 mg via INTRAVENOUS
  Administered 2024-03-28: 40 mg via INTRAVENOUS

## 2024-03-28 MED ORDER — GLYCOPYRROLATE 0.2 MG/ML IJ SOLN
INTRAMUSCULAR | Status: DC | PRN
  Administered 2024-03-28: .2 mg via INTRAVENOUS

## 2024-03-28 SURGICAL SUPPLY — 5 items
FORCEPS BIOP RAD 4 LRG CAP 4 (CUTTING FORCEPS) IMPLANT
GOWN CVR UNV OPN BCK APRN NK (MISCELLANEOUS) ×4 IMPLANT
KIT PRC NS LF DISP ENDO (KITS) ×2 IMPLANT
MANIFOLD NEPTUNE II (INSTRUMENTS) ×2 IMPLANT
WATER STERILE IRR 250ML POUR (IV SOLUTION) ×2 IMPLANT

## 2024-03-28 NOTE — H&P (Signed)
 [name], MD [facility], DHIP [address]  [place], [place] [address]  Main: [phone] Fax:  [phone] Pager: [phone]   Primary Care Physician:  Sowles, Krichna, MD Primary Gastroenterologist:  Dr. [name]  Pre-Procedure History & Physical: HPI:  Mikayla Fritz is a 53 y.o. female is here for an endoscopy and colonoscopy.   Past Medical History:  Diagnosis Date   Allergy    Anxiety    Depression    Dysplastic nevus 11/07/2019   R inf scapula - moderate   Dysplastic nevus 11/07/2019   R sup buttocks crease - moderate   Dysplastic nevus 11/07/2019   L med to sup med buttock - moderate   Dysplastic nevus 11/07/2019   L med thigh - moderate   Fatty liver    severe fatty liver seen on wellness scan   Generalized anxiety disorder    GERD (gastroesophageal reflux disease)    occasionally    Headache    Hypertension    Migraine    Mild intermittent reactive airway disease with wheezing without complication    Pre-diabetes    DIET CONTROLLED    Past Surgical History:  Procedure Laterality Date   COLONOSCOPY N/A    x 2   TONSILLECTOMY  2002   hemorrhage 3 days post op- taken back to surgery    Prior to Admission medications   Medication Sig Start Date End Date Taking? Authorizing Provider  albuterol  (VENTOLIN  HFA) 108 (90 Base) MCG/ACT inhaler TAKE 2 PUFFS BY MOUTH EVERY 6 HOURS AS NEEDED FOR WHEEZE OR SHORTNESS OF BREATH 01/19/24  Yes Sowles, Krichna, MD  atenolol  (TENORMIN ) 25 MG tablet Take 1 tablet (25 mg total) by mouth daily. 01/19/24  Yes Sowles, Krichna, MD  Multiple Vitamins-Minerals (MULTIVITAMIN WITH MINERALS) tablet Take 1 tablet by mouth daily.   Yes [provider]  omega-3 acid ethyl esters (LOVAZA ) 1 g capsule Take 1 capsule (1 g total) by mouth 2 (two) times daily. 01/19/24  Yes Sowles, Krichna, MD  omeprazole  (PRILOSEC) 20 MG capsule Take 20 mg by mouth as needed.   Yes [provider]   rosuvastatin  (CRESTOR ) 10 MG tablet Take 1 tablet (10 mg total) by mouth daily. 01/19/24  Yes Sowles, Krichna, MD  venlafaxine  XR (EFFEXOR  XR) 75 MG 24 hr capsule Take 1 capsule (75 mg total) by mouth daily with breakfast. 01/19/24  Yes Sowles, Krichna, MD    Allergies as of 03/22/2024 - Review Complete 03/17/2024  Allergen Reaction Noted   Clindamycin/lincomycin Itching, Nausea Only, and Other (See Comments) 09/01/2019   Codeine  07/12/2008    Family History  Problem Relation Age of Onset   Heart disease Mother    Cancer Mother        Kidney   Heart attack Mother        3   Arthritis Mother    Thyroid disease Mother    Kidney disease Mother    Hypertension Father    Diabetes Father    Anxiety disorder Brother    Cancer Maternal Grandmother        Breast   Breast cancer Maternal Grandmother     Social History   Socioeconomic History   Marital status: Married    Spouse name: Elna    Number of children: 1   Years of education: Not on file   Highest education level: Some college, no degree  Occupational History   Not on file  Tobacco Use  Smoking status: Never   Smokeless tobacco: Never  Vaping Use   Vaping status: Never Used  Substance and Sexual Activity   Alcohol use: Never   Drug use: No   Sexual activity: Yes    Partners: Male    Comment: Husband had vasectomy  Other Topics Concern   Not on file  Social History Narrative   Works at [employer], married   Social Drivers of Corporate investment banker Strain: Low Risk  (03/22/2024)   Received from [facility]   Overall Financial Resource Strain (CARDIA)    Difficulty of Paying Living Expenses: Not hard at all  Food Insecurity: No Food Insecurity (03/22/2024)   [facility] from [facility]   Hunger Vital Sign    Within the past 12 months, you worried that your food would run out before you got the money to buy more.: Never true    Within the past 12 months, the food you  bought just didn't last and you didn't have money to get more.: Never true  Transportation Needs: No Transportation Needs (03/22/2024)   Received from [facility] - Transportation    In the past 12 months, has lack of transportation kept you from medical appointments or from getting medications?: No    Lack of Transportation (Non-Medical): No  Physical Activity: Inactive (01/15/2024)   Exercise Vital Sign    Days of Exercise per Week: 0 days    Minutes of Exercise per Session: Not on file  Stress: No Stress Concern Present (01/15/2024)   [employer] of Occupational Health - Occupational Stress Questionnaire    Feeling of Stress: Not at all  Social Connections: Moderately Isolated (01/15/2024)   Social Connection and Isolation Panel    Frequency of Communication with Friends and Family: More than three times a week    Frequency of Social Gatherings with Friends and Family: More than three times a week    Attends Religious Services: Never    Database administrator or Organizations: No    Attends Engineer, structural: Not on file    Marital Status: Married  Catering manager Violence: Not At Risk (07/29/2022)   Humiliation, Afraid, Rape, and Kick questionnaire    Fear of Current or Ex-Partner: No    Emotionally Abused: No    Physically Abused: No    Sexually Abused: No    Review of Systems: See HPI, otherwise negative ROS  Physical Exam: BP (!) 116/57   Pulse 81   Temp 97.6 F (36.4 C) (Temporal)   Resp 16   Ht 5' 2 (1.575 m)   Wt 71.2 kg   LMP 03/18/2024 (Exact Date)   SpO2 95%   BMI 28.72 kg/m  General:   Alert,  pleasant and cooperative in NAD Head:  Normocephalic and atraumatic. Neck:  Supple; no masses or thyromegaly. Lungs:  Clear throughout to auscultation.    Heart:  Regular rate and rhythm. Abdomen:  Soft, nontender and nondistended. Normal bowel sounds, without guarding, and without rebound.   Neurologic:  Alert and  oriented  x4;  grossly normal neurologically.  Impression/Plan: Mikayla Fritz is here for an endoscopy and colonoscopy to be performed for diarrhea  Risks, benefits, limitations, and alternatives regarding  endoscopy and colonoscopy have been reviewed with the patient.  Questions have been answered.  All parties agreeable.   Corinn Brooklyn, MD  03/28/2024, 8:25 AM

## 2024-03-28 NOTE — Op Note (Signed)
 [facility] Patient Name: Mikayla Fritz Procedure Date: 03/28/2024 9:10 AM MRN: [id number] Account #: [id number] Date of Birth: 11-30-1970 Admit Type: Outpatient Age: 53 Room: [facility] OR ROOM 01 Gender: Female Note Status: Finalized Instrument Name: Endoscope [id number] Procedure:             Upper GI endoscopy Indications:           Diarrhea Providers:             Corinn Jess Brooklyn MD, MD Referring MD:          Dorette FALCON. Sowles, MD (Referring MD) Medicines:             General Anesthesia Complications:         No immediate complications. Estimated blood loss: None. Procedure:             Pre-Anesthesia Assessment:                        - Prior to the procedure, a History and Physical was                         performed, and patient medications and allergies were                         reviewed. The patient is competent. The risks and                         benefits of the procedure and the sedation options and                         risks were discussed with the patient. All questions                         were answered and informed consent was obtained.                         Patient identification and proposed procedure were                         verified by the physician, the nurse, the                         anesthesiologist, the anesthetist and the technician                         in the pre-procedure area in the procedure room in the                         endoscopy suite. Mental Status Examination: alert and                         oriented. Airway Examination: normal oropharyngeal                         airway and neck mobility. Respiratory Examination:                         clear to auscultation. CV Examination: normal.  Prophylactic Antibiotics: The patient does not require                         prophylactic antibiotics. Prior Anticoagulants: The                         patient has taken no  anticoagulant or antiplatelet                         agents. ASA Grade Assessment: II - A patient with mild                         systemic disease. After reviewing the risks and                         benefits, the patient was deemed in satisfactory                         condition to undergo the procedure. The anesthesia                         plan was to use general anesthesia. Immediately prior                         to administration of medications, the patient was                         re-assessed for adequacy to receive sedatives. The                         heart rate, respiratory rate, oxygen saturations,                         blood pressure, adequacy of pulmonary ventilation, and                         response to care were monitored throughout the                         procedure. The physical status of the patient was                         re-assessed after the procedure.                        After obtaining informed consent, the endoscope was                         passed under direct vision. Throughout the procedure,                         the patient's blood pressure, pulse, and oxygen                         saturations were monitored continuously. The Endoscope                         was introduced through the mouth, and advanced to the  second part of duodenum. The upper GI endoscopy was                         accomplished without difficulty. The patient tolerated                         the procedure well. Findings:      The duodenal bulb and second portion of the duodenum were normal.       Biopsies were taken with a cold forceps for histology.      Diffuse mildly erythematous mucosa without bleeding was found in the       gastric body, in the gastric antrum and in the prepyloric region of the       stomach. Biopsies were taken with a cold forceps for histology.      The cardia and gastric fundus were normal on retroflexion.       LA Grade B (one or more mucosal breaks greater than 5 mm, not extending       between the tops of two mucosal folds) esophagitis with no bleeding was       found at the gastroesophageal junction.      The examined esophagus was normal. Impression:            - Normal duodenal bulb and second portion of the                         duodenum. Biopsied.                        - Erythematous mucosa in the gastric body, antrum and                         prepyloric region of the stomach. Biopsied.                        - LA Grade B reflux esophagitis with no bleeding.                        - Normal esophagus. Recommendation:        - Await pathology results.                        - Proceed with colonoscopy as scheduled                        See colonoscopy report Procedure Code(s):     --- Professional ---                        [phone], Esophagogastroduodenoscopy, flexible,                         transoral; with biopsy, single or multiple Diagnosis Code(s):     --- Professional ---                        K31.89, Other diseases of stomach and duodenum                        K21.00, Gastro-esophageal reflux disease with  esophagitis, without bleeding                        R19.7, Diarrhea, unspecified CPT copyright 2022 American Medical Association. All rights reserved. The codes documented in this report are preliminary and upon coder review may  be revised to meet current compliance requirements. Dr. Corinn Brooklyn Corinn Jess Brooklyn MD, MD 03/28/2024 9:29:15 AM This report has been signed electronically. Number of Addenda: 0 Note Initiated On: 03/28/2024 9:10 AM Estimated Blood Loss:  Estimated blood loss: none.      [facility]

## 2024-03-28 NOTE — Anesthesia Postprocedure Evaluation (Signed)
 Anesthesia Post Note  Patient: Mikayla Fritz  Procedure(s) Performed: COLONOSCOPY WITH BIOPSY (Rectum) EGD (ESOPHAGOGASTRODUODENOSCOPY) WITH BIOPSY (Mouth)  Patient location during evaluation: PACU Anesthesia Type: General Level of consciousness: awake and alert Pain management: pain level controlled Vital Signs Assessment: post-procedure vital signs reviewed and stable Respiratory status: spontaneous breathing, nonlabored ventilation, respiratory function stable and patient connected to nasal cannula oxygen Cardiovascular status: blood pressure returned to baseline and stable Postop Assessment: no apparent nausea or vomiting Anesthetic complications: no   No notable events documented.   Last Vitals:  Vitals:   03/28/24 0951 03/28/24 1000  BP: (!) 106/58 119/73  Pulse: 92 94  Resp: 14 17  Temp: (!) 36.3 C (!) 36.3 C  SpO2: 94% 97%    Last Pain:  Vitals:   03/28/24 1000  TempSrc:   PainSc: 0-No pain                 Tanuj Mullens C Stanley Helmuth

## 2024-03-28 NOTE — Transfer of Care (Signed)
 Immediate Anesthesia Transfer of Care Note  Patient: Mikayla Fritz  Procedure(s) Performed: COLONOSCOPY WITH BIOPSY (Rectum) EGD (ESOPHAGOGASTRODUODENOSCOPY) WITH BIOPSY (Mouth)  Patient Location: PACU  Anesthesia Type: General  Level of Consciousness: awake, alert  and patient cooperative  Airway and Oxygen Therapy: Patient Spontanous Breathing and Patient connected to supplemental oxygen  Post-op Assessment: Post-op Vital signs reviewed, Patient's Cardiovascular Status Stable, Respiratory Function Stable, Patent Airway and No signs of Nausea or vomiting  Post-op Vital Signs: Reviewed and stable  Complications: No notable events documented.

## 2024-03-28 NOTE — Op Note (Signed)
 [facility] Patient Name: Mikayla Fritz Procedure Date: 03/28/2024 9:09 AM MRN: [id number] Account #: [id number] Date of Birth: May 07, 1971 Admit Type: Outpatient Age: 53 Room: [facility] OR ROOM 01 Gender: Female Note Status: Finalized Instrument Name: Colonoscope [id number] Procedure:             Colonoscopy Indications:           Clinically significant diarrhea of unexplained origin Providers:             Corinn Jess Brooklyn MD, MD Referring MD:          Dorette FALCON. Sowles, MD (Referring MD) Medicines:             General Anesthesia Complications:         No immediate complications. Estimated blood loss: None. Procedure:             Pre-Anesthesia Assessment:                        - Prior to the procedure, a History and Physical was                         performed, and patient medications and allergies were                         reviewed. The patient is competent. The risks and                         benefits of the procedure and the sedation options and                         risks were discussed with the patient. All questions                         were answered and informed consent was obtained.                         Patient identification and proposed procedure were                         verified by the physician, the nurse, the                         anesthesiologist, the anesthetist and the technician                         in the pre-procedure area in the procedure room in the                         endoscopy suite. Mental Status Examination: alert and                         oriented. Airway Examination: normal oropharyngeal                         airway and neck mobility. Respiratory Examination:                         clear to auscultation. CV Examination: normal.  Prophylactic Antibiotics: The patient does not require                         prophylactic antibiotics. Prior Anticoagulants: The                          patient has taken no anticoagulant or antiplatelet                         agents. ASA Grade Assessment: II - A patient with mild                         systemic disease. After reviewing the risks and                         benefits, the patient was deemed in satisfactory                         condition to undergo the procedure. The anesthesia                         plan was to use general anesthesia. Immediately prior                         to administration of medications, the patient was                         re-assessed for adequacy to receive sedatives. The                         heart rate, respiratory rate, oxygen saturations,                         blood pressure, adequacy of pulmonary ventilation, and                         response to care were monitored throughout the                         procedure. The physical status of the patient was                         re-assessed after the procedure.                        After obtaining informed consent, the colonoscope was                         passed under direct vision. Throughout the procedure,                         the patient's blood pressure, pulse, and oxygen                         saturations were monitored continuously. The                         Colonoscope was introduced through the anus and  advanced to the the terminal ileum, with                         identification of the appendiceal orifice and IC                         valve. The colonoscopy was performed without                         difficulty. The patient tolerated the procedure well.                         The quality of the bowel preparation was evaluated                         using the BBPS [facility] Bowel Preparation Scale) with                         scores of: Right Colon = 3, Transverse Colon = 3 and                         Left Colon = 3 (entire mucosa seen well with no                          residual staining, small fragments of stool or opaque                         liquid). The total BBPS score equals 9. The terminal                         ileum, ileocecal valve, appendiceal orifice, and                         rectum were photographed. Findings:      The perianal and digital rectal examinations were normal. Pertinent       negatives include normal sphincter tone and no palpable rectal lesions.      The terminal ileum appeared normal.      The colon (entire examined portion) appeared normal. Biopsies for       histology were taken with a cold forceps from the entire colon for       evaluation of microscopic colitis.      The retroflexed view of the distal rectum and anal verge was normal and       showed no anal or rectal abnormalities. Impression:            - The examined portion of the ileum was normal.                        - The entire examined colon is normal. Biopsied.                        - The distal rectum and anal verge are normal on                         retroflexion view. Recommendation:        - Discharge patient to home (with escort).                        -  Resume previous diet today.                        - Continue present medications.                        - Await pathology results.                        - Repeat colonoscopy in 10 years for screening                         purposes. Procedure Code(s):     --- Professional ---                        [phone], Colonoscopy, flexible; with biopsy, single or                         multiple Diagnosis Code(s):     --- Professional ---                        R19.7, Diarrhea, unspecified CPT copyright 2022 American Medical Association. All rights reserved. The codes documented in this report are preliminary and upon coder review may  be revised to meet current compliance requirements. Dr. Corinn Brooklyn Corinn Jess Brooklyn MD, MD 03/28/2024 9:49:24 AM This report has been signed electronically. Number  of Addenda: 0 Note Initiated On: 03/28/2024 9:09 AM Scope Withdrawal Time: 0 hours 11 minutes 51 seconds  Total Procedure Duration: 0 hours 16 minutes 6 seconds  Estimated Blood Loss:  Estimated blood loss: none.      [facility]

## 2024-03-29 ENCOUNTER — Ambulatory Visit
Admission: RE | Admit: 2024-03-29 | Discharge: 2024-03-29 | Disposition: A | Discharge status: Home / Self Care | Source: Ambulatory Visit | Attending: Gastroenterology | Admitting: Gastroenterology

## 2024-03-29 DIAGNOSIS — K760 Fatty (change of) liver, not elsewhere classified: Secondary | ICD-10-CM | POA: Diagnosis not present

## 2024-03-29 DIAGNOSIS — R7989 Other specified abnormal findings of blood chemistry: Secondary | ICD-10-CM | POA: Insufficient documentation

## 2024-03-29 LAB — SURGICAL PATHOLOGY

## 2024-03-30 ENCOUNTER — Ambulatory Visit: Payer: Self-pay | Admitting: Gastroenterology

## 2024-03-30 NOTE — Progress Notes (Signed)
 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

## 2024-04-04 ENCOUNTER — Ambulatory Visit: Payer: Self-pay | Admitting: Gastroenterology

## 2024-04-21 ENCOUNTER — Ambulatory Visit

## 2025-01-19 ENCOUNTER — Ambulatory Visit: Admitting: Family Medicine
